# Patient Record
Sex: Female | Born: 1985 | Race: White | Marital: Single | Smoking: Former smoker
Health system: Southern US, Community
[De-identification: ages and names within clinical notes are randomized; demographics above are authoritative.]

## PROBLEM LIST (undated history)

## (undated) DIAGNOSIS — F419 Anxiety disorder, unspecified: Secondary | ICD-10-CM

## (undated) DIAGNOSIS — A630 Anogenital (venereal) warts: Secondary | ICD-10-CM

## (undated) HISTORY — DX: Anogenital (venereal) warts: A63.0

## (undated) HISTORY — DX: Anxiety disorder, unspecified: F41.9

---

## 2004-02-10 HISTORY — PX: CERVICAL BIOPSY  W/ LOOP ELECTRODE EXCISION: SUR135

## 2010-08-01 HISTORY — PX: CERVICAL BIOPSY: SHX590

## 2010-08-22 ENCOUNTER — Other Ambulatory Visit: Payer: Self-pay | Admitting: Internal Medicine

## 2010-08-22 DIAGNOSIS — R1013 Epigastric pain: Secondary | ICD-10-CM

## 2010-08-22 DIAGNOSIS — R102 Pelvic and perineal pain: Secondary | ICD-10-CM

## 2010-08-22 DIAGNOSIS — IMO0002 Reserved for concepts with insufficient information to code with codable children: Secondary | ICD-10-CM

## 2010-08-29 ENCOUNTER — Ambulatory Visit
Admission: RE | Admit: 2010-08-29 | Discharge: 2010-08-29 | Disposition: A | Discharge status: Home / Self Care | Payer: BC Managed Care – PPO | Source: Ambulatory Visit | Attending: Internal Medicine | Admitting: Internal Medicine

## 2010-08-29 ENCOUNTER — Other Ambulatory Visit: Payer: Self-pay

## 2010-08-29 DIAGNOSIS — R102 Pelvic and perineal pain: Secondary | ICD-10-CM

## 2010-08-29 DIAGNOSIS — IMO0002 Reserved for concepts with insufficient information to code with codable children: Secondary | ICD-10-CM

## 2010-08-29 DIAGNOSIS — R1013 Epigastric pain: Secondary | ICD-10-CM

## 2011-01-24 ENCOUNTER — Ambulatory Visit (INDEPENDENT_AMBULATORY_CARE_PROVIDER_SITE_OTHER): Payer: BC Managed Care – PPO

## 2011-01-24 DIAGNOSIS — Z23 Encounter for immunization: Secondary | ICD-10-CM

## 2011-02-15 ENCOUNTER — Ambulatory Visit (INDEPENDENT_AMBULATORY_CARE_PROVIDER_SITE_OTHER): Payer: BC Managed Care – PPO

## 2011-02-15 DIAGNOSIS — K5100 Ulcerative (chronic) pancolitis without complications: Secondary | ICD-10-CM

## 2011-04-24 ENCOUNTER — Ambulatory Visit (INDEPENDENT_AMBULATORY_CARE_PROVIDER_SITE_OTHER): Payer: BC Managed Care – PPO | Admitting: Physician Assistant

## 2011-04-24 VITALS — BP 119/81 | HR 83 | Temp 98.9°F | Resp 16 | Ht 63.0 in | Wt 115.8 lb

## 2011-04-24 DIAGNOSIS — R05 Cough: Secondary | ICD-10-CM

## 2011-04-24 DIAGNOSIS — J40 Bronchitis, not specified as acute or chronic: Secondary | ICD-10-CM

## 2011-04-24 DIAGNOSIS — R059 Cough, unspecified: Secondary | ICD-10-CM

## 2011-04-24 DIAGNOSIS — F419 Anxiety disorder, unspecified: Secondary | ICD-10-CM

## 2011-04-24 DIAGNOSIS — F411 Generalized anxiety disorder: Secondary | ICD-10-CM

## 2011-04-24 DIAGNOSIS — J019 Acute sinusitis, unspecified: Secondary | ICD-10-CM

## 2011-04-24 MED ORDER — CEFDINIR 300 MG PO CAPS: 300.0000 mg | ORAL_CAPSULE | Freq: Two times a day (BID) | ORAL | Status: AC

## 2011-04-24 MED ORDER — ALPRAZOLAM 0.25 MG PO TABS: 0.2500 mg | ORAL_TABLET | Freq: Two times a day (BID) | ORAL | Status: AC | PRN

## 2011-04-24 MED ORDER — FLUCONAZOLE 150 MG PO TABS: 150.0000 mg | ORAL_TABLET | Freq: Once | ORAL | Status: AC

## 2011-04-24 MED ORDER — IPRATROPIUM BROMIDE 0.06 % NA SOLN: 2.0000 | Freq: Three times a day (TID) | NASAL | Status: AC

## 2011-04-24 MED ORDER — HYDROCODONE-HOMATROPINE 5-1.5 MG/5ML PO SYRP: ORAL_SOLUTION | ORAL | Status: AC

## 2011-04-24 NOTE — Progress Notes (Signed)
Patient ID: Carol Castillo MRN: [id number], DOB: 02/28/1985, 26 y.o. Date of Encounter: 04/24/2011, 11:41 AM  Primary Physician: No primary provider on file.  Chief Complaint:  Chief Complaint  Patient presents with  . Cough    4 weeks  . Sinusitis    HPI: 26 y.o. year old female presents with 14 day history of nasal congestion, post nasal drip, sore throat, sinus pressure, and cough. Afebrile. No chills. Nasal congestion thick and green/yellow. Cough is productive of green/yellow sputum and worse at night when she lays down, keeping her awake. Ears feel full, leading to sensation of muffled hearing. Has tried OTC cold preps without success. No GI complaints. Appetite normal. Multiple sick contacts at one job.  She also mentions increased stress and anxiety at work. She recently started a second job. Currently working 60+ hours per week. Very anxious at her new job. Causing her to cry at home. Feels overwhelmed. No depression. Interested in counseling,  But just does not have the time for that currently with her work demands. She plans to cut back her hours over the summer, and will pursue counseling at that time.   No recent antibiotics, or recent travels.   No leg trauma, sedentary periods, or h/o cancer.  Past Medical History  Diagnosis Date  . Anxiety      Home Meds: Prior to Admission medications   Not on File    Allergies: No Known Allergies  History   Social History  . Marital Status: Single    Spouse Name: N/A    Number of Children: N/A  . Years of Education: N/A   Occupational History  . Not on file.   Social History Main Topics  . Smoking status: Current Everyday Smoker -- 0.8 packs/day    Types: Cigarettes  . Smokeless tobacco: Not on file  . Alcohol Use: Not on file  . Drug Use: Not on file  . Sexually Active: Not on file   Other Topics Concern  . Not on file   Social History Narrative  . No narrative on file     Review of  Systems: Constitutional: negative for chills, fever, night sweats or weight changes Cardiovascular: negative for chest pain or palpitations Respiratory: negative for hemoptysis, wheezing, or shortness of breath Abdominal: negative for abdominal pain, nausea, vomiting or diarrhea Dermatological: negative for rash Neurologic: negative for headache   Physical Exam: Blood pressure 119/81, pulse 83, temperature 98.9 F (37.2 C), temperature source Oral, resp. rate 16, height 5\' 3"  (1.6 m), weight 115 lb 12.8 oz (52.527 kg), last menstrual period 04/24/2011., Body mass index is 20.51 kg/(m^2). General: Well developed, well nourished, in no acute distress. Head: Normocephalic, atraumatic, eyes without discharge, sclera non-icteric, nares are congested. Bilateral auditory canals clear, TM's are without perforation, pearly grey with reflective cone of light bilaterally. Serous effusion bilaterally behind TM's.  Left maxillary sinus TTP. Oral cavity moist, dentition normal. Posterior pharynx with post nasal drip and mild erythema. No peritonsillar abscess or tonsillar exudate. Neck: Supple. No thyromegaly. Full ROM. No lymphadenopathy. Lungs: Clear bilaterally to auscultation without wheezes, rales, or rhonchi. Breathing is unlabored.  Heart: RRR with S1 S2. No murmurs, rubs, or gallops appreciated. Msk:  Strength and tone normal for age. Extremities: No clubbing or cyanosis. No edema. Neuro: Alert and oriented X 3. Moves all extremities spontaneously. CNII-XII grossly in tact. Psych:  Responds to questions appropriately with a normal affect.     ASSESSMENT AND PLAN:  26 y.o. year  old female with sinobronchitis and anxiety. 1. Sinobronchitis -Omnicef 300 mg 1 po bid #20 no RF  -Atrovent NS 0.06% 2 sprays each nare bid prn #1 no RF -Hycodan #4oz 1 tsp po q 4-6 hours prn cough no RF SED -Mucinex -Tylenol/Motrin prn -Rest/fluids -RTC precautions -RTC 3-5 days if no improvement -Fluconazole 150  mg #1 1 po now RF 1  2. Anxiety -Secondary to increased stress at 2 jobs -Plans to cut back on hours, this should help -Interested in seeing a psychologist over the summer once she has cut back some of her hours and has time during the day to have regular follow up with a psychologist -Trial of Xanax 0.25 mg #60 1 po bid no RF SED -Patient was advised the xanax is a temporary measure and that her definitive treatment will be counseling, she agrees and prefers counseling -Call with update 1 month  Signed, Eula Listen, PA-C 04/24/2011 11:41 AM

## 2011-06-02 ENCOUNTER — Telehealth: Payer: Self-pay

## 2011-06-02 NOTE — Telephone Encounter (Signed)
Pt is calling in about her rx refill on xanax and would like to let Eula Listen know that everything is okay and she is doing well with the dose of xanax she is currently taking.

## 2011-06-03 ENCOUNTER — Other Ambulatory Visit: Payer: Self-pay | Admitting: Physician Assistant

## 2011-06-29 ENCOUNTER — Telehealth: Payer: Self-pay

## 2011-06-29 NOTE — Telephone Encounter (Signed)
Pt wanted to let dr Hal Hope know she has quit smoking and wants to get a lo estrogen birth control pill Please call pt to advise

## 2011-06-30 NOTE — Telephone Encounter (Signed)
Paper chart please 

## 2011-06-30 NOTE — Telephone Encounter (Signed)
Chart pulled to PA 

## 2011-07-01 NOTE — Telephone Encounter (Signed)
Ok, come into the clinic so we can do this.

## 2011-07-01 NOTE — Telephone Encounter (Signed)
Explained that pt will need to be seen 1st. Pt agreed but works two jobs and has a hard time making the time to get in. Advised pt she might try to come in 1st thing in the am at 102, and transferred pt to 104 to check for avail appts.

## 2011-07-21 ENCOUNTER — Encounter: Payer: Self-pay | Admitting: Family Medicine

## 2011-07-21 ENCOUNTER — Ambulatory Visit (INDEPENDENT_AMBULATORY_CARE_PROVIDER_SITE_OTHER): Payer: BC Managed Care – PPO | Admitting: Family Medicine

## 2011-07-21 VITALS — BP 119/79 | HR 72 | Temp 98.7°F | Resp 18 | Ht 63.0 in | Wt 152.8 lb

## 2011-07-21 DIAGNOSIS — IMO0001 Reserved for inherently not codable concepts without codable children: Secondary | ICD-10-CM

## 2011-07-21 DIAGNOSIS — Z309 Encounter for contraceptive management, unspecified: Secondary | ICD-10-CM

## 2011-07-21 DIAGNOSIS — F17200 Nicotine dependence, unspecified, uncomplicated: Secondary | ICD-10-CM

## 2011-07-21 DIAGNOSIS — Z8742 Personal history of other diseases of the female genital tract: Secondary | ICD-10-CM

## 2011-07-21 DIAGNOSIS — Z3009 Encounter for other general counseling and advice on contraception: Secondary | ICD-10-CM

## 2011-07-21 DIAGNOSIS — Z720 Tobacco use: Secondary | ICD-10-CM | POA: Insufficient documentation

## 2011-07-21 MED ORDER — NORETHIN-ETH ESTRAD-FE BIPHAS 1 MG-10 MCG / 10 MCG PO TABS: 1.0000 | ORAL_TABLET | Freq: Every day | ORAL | Status: DC

## 2011-07-21 NOTE — Progress Notes (Signed)
  Subjective:    Patient ID: Carol Castillo, female    DOB: [birth year], 26 y.o.   MRN: [id number]  HPI  This 26 y.o. Cauc female is here for contraception counseling, requesting OCPs- low dose.  She took OCPs 5 years ago but had some minor side effects. PMHx remarkable for Abnormal PAP  with LEEP procedure in 2006 and recent biopsy summer 2012. Received HPV vaccine series last year.  She has not returned to [facility] for follow-up (was due in Jan 2013). Not prepared for  CPE/PAP today. Menses are regular and pt denies pelvic pain or abnormal bleeding. No hx of headache  disorder or cardiac disease or clotting disorder.         She is getting married in September and would like to get established on contraception; she  smokes < 1/2 ppd and plans to quit smoking before her wedding day. Firmly states that she does not want   pregnancy at this time. She has had a lot of anxiety associated with 2 jobs which led to relapse with tobacco  use. 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 She uses Alprazolam sparingly.     Review of Systems As per HPI    Objective:   Physical Exam  Vitals reviewed. Constitutional: She is oriented to person, place, and time. She appears well-developed and well-nourished. No distress.  HENT:  Head: Normocephalic and atraumatic.  Eyes: Conjunctivae and EOM are normal. No scleral icterus.  Cardiovascular: Normal rate.   Pulmonary/Chest: Effort normal. No respiratory distress.  Musculoskeletal: Normal range of motion.  Neurological: She is alert and oriented to person, place, and time. No cranial nerve deficit.  Psychiatric: She has a normal mood and affect. Her behavior is normal.          Assessment & Plan:   1. General counselling and advice on contraception - offered extended cycle OCP but, given pt's Abnormal PAP hx, feel she  should continue to have menstrual cycle monthly  2. Contraception - RX: Lo Loestrin Fe  1 package with 5 RFs  3. History of abnormal cervical Pap smear - pt encouraged to schedule GYN re-evaluation at earliest convenience  4. Tobacco user - smoking cessation ASAP; discussed risks of tobacco use with OCPs

## 2011-07-21 NOTE — Patient Instructions (Addendum)
Smoking Cessation This document explains the best ways for you to quit smoking and new treatments to help. It lists new medicines that can double or triple your chances of quitting and quitting for good. It also considers ways to avoid relapses and concerns you may have about quitting, including weight gain. NICOTINE: A POWERFUL ADDICTION If you have tried to quit smoking, you know how hard it can be. It is hard because nicotine is a very addictive drug. For some people, it can be as addictive as heroin or cocaine. Usually, people make 2 or 3 tries, or more, before finally being able to quit. Each time you try to quit, you can learn about what helps and what hurts. Quitting takes hard work and a lot of effort, but you can quit smoking. QUITTING SMOKING IS ONE OF THE MOST IMPORTANT THINGS YOU WILL EVER DO.  You will live longer, feel better, and live better.   The impact on your body of quitting smoking is felt almost immediately:   Within 20 minutes, blood pressure decreases. Pulse returns to its normal level.   After 8 hours, carbon monoxide levels in the blood return to normal. Oxygen level increases.   After 24 hours, chance of heart attack starts to decrease. Breath, hair, and body stop smelling like smoke.   After 48 hours, damaged nerve endings begin to recover. Sense of taste and smell improve.   After 72 hours, the body is virtually free of nicotine. Bronchial tubes relax and breathing becomes easier.   After 2 to 12 weeks, lungs can hold more air. Exercise becomes easier and circulation improves.   Quitting will reduce your risk of having a heart attack, stroke, cancer, or lung disease:   After 1 year, the risk of coronary heart disease is cut in half.   After 5 years, the risk of stroke falls to the same as a nonsmoker.   After 10 years, the risk of lung cancer is cut in half and the risk of other cancers decreases significantly.   After 15 years, the risk of coronary heart  disease drops, usually to the level of a nonsmoker.   If you are pregnant, quitting smoking will improve your chances of having a healthy baby.   The people you live with, especially your children, will be healthier.   You will have extra money to spend on things other than cigarettes.  FIVE KEYS TO QUITTING Studies have shown that these 5 steps will help you quit smoking and quit for good. You have the best chances of quitting if you use them together: 1. Get ready.  2. Get support and encouragement.  3. Learn new skills and behaviors.  4. Get medicine to reduce your nicotine addiction and use it correctly.  5. Be prepared for relapse or difficult situations. Be determined to continue trying to quit, even if you do not succeed at first.  1. GET READY  Set a quit date.   Change your environment.   Get rid of ALL cigarettes, ashtrays, matches, and lighters in your home, car, and place of work.   Do not let people smoke in your home.   Review your past attempts to quit. Think about what worked and what did not.   Once you quit, do not smoke. NOT EVEN A PUFF!  2. GET SUPPORT AND ENCOURAGEMENT Studies have shown that you have a better chance of being successful if you have help. You can get support in many ways.  Tell   your family, friends, and coworkers that you are going to quit and need their support. Ask them not to smoke around you.   Talk to your caregivers (doctor, dentist, nurse, pharmacist, psychologist, and/or smoking counselor).   Get individual, group, or telephone counseling and support. The more counseling you have, the better your chances are of quitting. Programs are available at local hospitals and health centers. Call your local health department for information about programs in your area.   Spiritual beliefs and practices may help some smokers quit.   Quit meters are small computer programs online or downloadable that keep track of quit statistics, such as amount  of "quit-time," cigarettes not smoked, and money saved.   Many smokers find one or more of the many self-help books available useful in helping them quit and stay off tobacco.  3. LEARN NEW SKILLS AND BEHAVIORS  Try to distract yourself from urges to smoke. Talk to someone, go for a walk, or occupy your time with a task.   When you first try to quit, change your routine. Take a different route to work. Drink tea instead of coffee. Eat breakfast in a different place.   Do something to reduce your stress. Take a hot bath, exercise, or read a book.   Plan something enjoyable to do every day. Reward yourself for not smoking.   Explore interactive web-based programs that specialize in helping you quit.  4. GET MEDICINE AND USE IT CORRECTLY Medicines can help you stop smoking and decrease the urge to smoke. Combining medicine with the above behavioral methods and support can quadruple your chances of successfully quitting smoking. The U.S. Food and Drug Administration (FDA) has approved 7 medicines to help you quit smoking. These medicines fall into 3 categories.  Nicotine replacement therapy (delivers nicotine to your body without the negative effects and risks of smoking):   Nicotine gum: Available over-the-counter.   Nicotine lozenges: Available over-the-counter.   Nicotine inhaler: Available by prescription.   Nicotine nasal spray: Available by prescription.   Nicotine skin patches (transdermal): Available by prescription and over-the-counter.   Antidepressant medicine (helps people abstain from smoking, but how this works is unknown):   Bupropion sustained-release (SR) tablets: Available by prescription.   Nicotinic receptor partial agonist (simulates the effect of nicotine in your brain):   Varenicline tartrate tablets: Available by prescription.   Ask your caregiver for advice about which medicines to use and how to use them. Carefully read the information on the package.    Everyone who is trying to quit may benefit from using a medicine. If you are pregnant or trying to become pregnant, nursing an infant, you are under age 18, or you smoke fewer than 10 cigarettes per day, talk to your caregiver before taking any nicotine replacement medicines.   You should stop using a nicotine replacement product and call your caregiver if you experience nausea, dizziness, weakness, vomiting, fast or irregular heartbeat, mouth problems with the lozenge or gum, or redness or swelling of the skin around the patch that does not go away.   Do not use any other product containing nicotine while using a nicotine replacement product.   Talk to your caregiver before using these products if you have diabetes, heart disease, asthma, stomach ulcers, you had a recent heart attack, you have high blood pressure that is not controlled with medicine, a history of irregular heartbeat, or you have been prescribed medicine to help you quit smoking.  5. BE PREPARED FOR RELAPSE OR   DIFFICULT SITUATIONS  Most relapses occur within the first 3 months after quitting. Do not be discouraged if you start smoking again. Remember, most people try several times before they finally quit.   You may have symptoms of withdrawal because your body is used to nicotine. You may crave cigarettes, be irritable, feel very hungry, cough often, get headaches, or have difficulty concentrating.   The withdrawal symptoms are only temporary. They are strongest when you first quit, but they will go away within 10 to 14 days.  Here are some difficult situations to watch for:  Alcohol. Avoid drinking alcohol. Drinking lowers your chances of successfully quitting.   Caffeine. Try to reduce the amount of caffeine you consume. It also lowers your chances of successfully quitting.   Other smokers. Being around smoking can make you want to smoke. Avoid smokers.   Weight gain. Many smokers will gain weight when they quit, usually  less than 10 pounds. Eat a healthy diet and stay active. Do not let weight gain distract you from your main goal, quitting smoking. Some medicines that help you quit smoking may also help delay weight gain. You can always lose the weight gained after you quit.   Bad mood or depression. There are a lot of ways to improve your mood other than smoking.  If you are having problems with any of these situations, talk to your caregiver. SPECIAL SITUATIONS AND CONDITIONS Studies suggest that everyone can quit smoking. Your situation or condition can give you a special reason to quit.  Pregnant women/new mothers: By quitting, you protect your baby's health and your own.   Hospitalized patients: By quitting, you reduce health problems and help healing.   Heart attack patients: By quitting, you reduce your risk of a second heart attack.   Lung, head, and neck cancer patients: By quitting, you reduce your chance of a second cancer.   Parents of children and adolescents: By quitting, you protect your children from illnesses caused by secondhand smoke.  QUESTIONS TO THINK ABOUT Think about the following questions before you try to stop smoking. You may want to talk about your answers with your caregiver.  Why do you want to quit?   If you tried to quit in the past, what helped and what did not?   What will be the most difficult situations for you after you quit? How will you plan to handle them?   Who can help you through the tough times? Your family? Friends? Caregiver?   What pleasures do you get from smoking? What ways can you still get pleasure if you quit?  Here are some questions to ask your caregiver:  How can you help me to be successful at quitting?   What medicine do you think would be best for me and how should I take it?   What should I do if I need more help?   What is smoking withdrawal like? How can I get information on withdrawal?  Quitting takes hard work and a lot of effort,  but you can quit smoking. FOR MORE INFORMATION  Smokefree.gov (http://www.davis-sullivan.com/) provides free, accurate, evidence-based information and professional assistance to help support the immediate and long-term needs of people trying to quit smoking. Document Released: 01/20/2001 Document Revised: 01/15/2011 Document Reviewed: 11/12/2008 [facility] Patient Information 2012 [place], [place].     Schedule your follow-up visit with GYN within next 4-6 months, if not sooner.  Please plan on quitting tobacco use as soon as possible.

## 2011-07-30 ENCOUNTER — Telehealth: Payer: Self-pay

## 2011-07-30 MED ORDER — NORETHINDRONE ACET-ETHINYL EST 1-20 MG-MCG PO TABS: 1.0000 | ORAL_TABLET | Freq: Every day | ORAL | Status: DC

## 2011-07-30 NOTE — Telephone Encounter (Signed)
PATIENT SAW [employer] AND WAS PRESCRIBED LOESTRA, BIRTH CONTROL.  SHE SAID IS TO EXPENSIVE AND WOULD LIKE THE REGULAR LOESTRA.  PLEASE CALL

## 2011-07-30 NOTE — Telephone Encounter (Signed)
Done and sent in 

## 2011-07-31 NOTE — Telephone Encounter (Signed)
LMOM that rx was sent to pharmacy 

## 2011-09-30 ENCOUNTER — Telehealth: Payer: Self-pay

## 2011-09-30 NOTE — Telephone Encounter (Signed)
Pt states her menses will interfere with her wedding and would like a different birth control or information on how to prevent being on menses during her honeymoon.  [facility]  please call [phone]

## 2011-09-30 NOTE — Telephone Encounter (Signed)
Dow Adolph, MD 07/21/2011 4:31 PM Signed    Subjective:    Patient ID: Carol Castillo, female DOB: 24-Sep-1985, 26 y.o. MRN: [id number]  HPI This 26 y.o. Cauc female is here for contraception counseling, requesting OCPs- low dose.  She took OCPs 5 years ago but had some minor side effects. PMHx remarkable for Abnormal PAP  with LEEP procedure in 2006 and recent biopsy summer 2012. Received HPV vaccine series last year.  She has not returned to [facility] for follow-up (was due in Jan 2013). Not prepared for  CPE/PAP today. Menses are regular and pt denies pelvic pain or abnormal bleeding. No hx of headache  disorder or cardiac disease or clotting disorder.  She is getting married in September and would like to get established on contraception; she  smokes < 1/2 ppd and plans to quit smoking before her wedding day. Firmly states that she does not want  pregnancy at this time. She has had a lot of anxiety associated with 2 jobs which led to relapse with tobacco  use. 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 She uses Alprazolam sparingly.  Review of Systems As per HPI     Objective:    Physical Exam  Vitals reviewed.  Constitutional: She is oriented to person, place, and time. She appears well-developed and well-nourished. No distress.  HENT:  Head: Normocephalic and atraumatic.  Eyes: Conjunctivae and EOM are normal. No scleral icterus.  Cardiovascular: Normal rate.  Pulmonary/Chest: Effort normal. No respiratory distress.  Musculoskeletal: Normal range of motion.  Neurological: She is alert and oriented to person, place, and time. No cranial nerve deficit.  Psychiatric: She has a normal mood and affect. Her behavior is normal.      Assessment & Plan:    1.  General counselling and advice on contraception - offered extended cycle OCP but, given pt's    Abnormal PAP hx, feel she should continue to have menstrual cycle monthly     2.  Contraception - RX: Lo Loestrin Fe 1 package with 5 RFs     3.  History of abnormal cervical Pap smear - pt encouraged to schedule GYN re-evaluation at earliest convenience     4.  Tobacco user - smoking cessation ASAP; discussed risks of tobacco use with OCPs           Electronic signature on 07/21/2011      Please advise message below, office visit from June above.

## 2011-10-01 NOTE — Telephone Encounter (Signed)
She can do continuous cycling, meaning she does not take placebo week and immediately starts next pack.

## 2011-10-01 NOTE — Telephone Encounter (Signed)
PATIENT CALLED AGAIN AND SAYS SHE HAS TRIED TO SKIP THE PLACEBO AND IMMEDIATELY START THE NEXT PACK PREVIOUSLY, BUT SHE STILL HAD HER PERIOD. SHE WAS WONDERING IF THERE IS SOMETHING ELSE SHE CAN TAKE TO MAKE SURE SHE DOES NOT HAVE HER PERIOD ON HER WEDDING DAY/  BEST [phone]  [id number] - [place],  - [address]

## 2011-10-02 NOTE — Telephone Encounter (Signed)
D/W pt that we could try a higher dose of OCP and she agreed that she would like to try that, but only wants one month's worth and for her RFs on Lo estrin to stay active bc she will want to go back to these afterward. No need to call pt back if there are no problems doing the above, she will just check w/her pharmacy.

## 2011-10-02 NOTE — Telephone Encounter (Signed)
Please contact she this concerning her wedding date and she needs response..[phone]

## 2011-10-02 NOTE — Telephone Encounter (Signed)
Is there anything else that would help?

## 2011-10-02 NOTE — Telephone Encounter (Signed)
We can try a higher dose OCP instead of her Lo estrin. Otherwise there are not a lot of other options.

## 2011-10-04 MED ORDER — NORETHINDRONE-MESTRANOL 1-50 MG-MCG PO TABS: 1.0000 | ORAL_TABLET | Freq: Every day | ORAL | Status: DC

## 2011-10-04 NOTE — Telephone Encounter (Signed)
Ortho-Novum 1/50 Rx'd.

## 2011-11-06 ENCOUNTER — Telehealth: Payer: Self-pay

## 2011-11-06 ENCOUNTER — Ambulatory Visit (INDEPENDENT_AMBULATORY_CARE_PROVIDER_SITE_OTHER): Payer: BC Managed Care – PPO | Admitting: Family Medicine

## 2011-11-06 VITALS — BP 118/70 | HR 82 | Temp 98.0°F | Resp 16 | Ht 63.0 in | Wt 147.0 lb

## 2011-11-06 DIAGNOSIS — K5289 Other specified noninfective gastroenteritis and colitis: Secondary | ICD-10-CM

## 2011-11-06 DIAGNOSIS — K529 Noninfective gastroenteritis and colitis, unspecified: Secondary | ICD-10-CM

## 2011-11-06 DIAGNOSIS — R51 Headache: Secondary | ICD-10-CM

## 2011-11-06 DIAGNOSIS — R110 Nausea: Secondary | ICD-10-CM

## 2011-11-06 DIAGNOSIS — R197 Diarrhea, unspecified: Secondary | ICD-10-CM

## 2011-11-06 DIAGNOSIS — R519 Headache, unspecified: Secondary | ICD-10-CM

## 2011-11-06 LAB — POCT CBC
Granulocyte percent: 56.1 %G (ref 37–80)
HCT, POC: 45.4 % (ref 37.7–47.9)
Hemoglobin: 14.3 g/dL (ref 12.2–16.2)
Lymph, poc: 2.3 (ref 0.6–3.4)
MCH, POC: 28 pg (ref 27–31.2)
MCHC: 31.5 g/dL — AB (ref 31.8–35.4)
MCV: 88.8 fL (ref 80–97)
MID (cbc): 0.4 (ref 0–0.9)
MPV: 9.4 fL (ref 0–99.8)
POC Granulocyte: 3.5 (ref 2–6.9)
POC LYMPH PERCENT: 37.3 %L (ref 10–50)
POC MID %: 6.6 %M (ref 0–12)
Platelet Count, POC: 280 10*3/uL (ref 142–424)
RBC: 5.11 M/uL (ref 4.04–5.48)
RDW, POC: 15 %
WBC: 6.3 10*3/uL (ref 4.6–10.2)

## 2011-11-06 LAB — COMPREHENSIVE METABOLIC PANEL WITH GFR
Albumin: 4.4 g/dL (ref 3.5–5.2)
CO2: 25 meq/L (ref 19–32)
Glucose, Bld: 84 mg/dL (ref 70–99)
Potassium: 4.6 meq/L (ref 3.5–5.3)
Sodium: 140 meq/L (ref 135–145)
Total Bilirubin: 0.4 mg/dL (ref 0.3–1.2)
Total Protein: 6.6 g/dL (ref 6.0–8.3)

## 2011-11-06 LAB — COMPREHENSIVE METABOLIC PANEL
ALT: 17 U/L (ref 0–35)
AST: 19 U/L (ref 0–37)
Alkaline Phosphatase: 48 U/L (ref 39–117)
BUN: 11 mg/dL (ref 6–23)
Calcium: 9.2 mg/dL (ref 8.4–10.5)
Chloride: 105 mEq/L (ref 96–112)
Creat: 0.81 mg/dL (ref 0.50–1.10)

## 2011-11-06 MED ORDER — CIPROFLOXACIN HCL 500 MG PO TABS: 500.0000 mg | ORAL_TABLET | Freq: Two times a day (BID) | ORAL | Status: DC

## 2011-11-06 MED ORDER — PROMETHAZINE HCL 25 MG PO TABS: 25.0000 mg | ORAL_TABLET | Freq: Three times a day (TID) | ORAL | Status: DC | PRN

## 2011-11-06 NOTE — Telephone Encounter (Signed)
The patient would like refill of Necon 1-50.  The patient stated she prefers this medication.  Please call the patient at [phone] with any questions.

## 2011-11-06 NOTE — Progress Notes (Signed)
[facility]:  Office Visit  Chief Complaint:  Chief Complaint  Patient presents with  . Headache    pressure in head- last night Pt was "confused"  . Nausea  . Fatigue    had very low temp last night- 95.6    HPI: Carol Castillo is a 26 y.o. female who complains of  nonbloody diarrhea, nausea and occipital HA and hypothermia x 4 days. Was in [place] for wedding and came home on Monday and started having diarrhea after every meal. Does not matter what type of foods she has. Tried Ibuprofen, Execrdin and some pain med without relief. Some vision changes.-blurry lines that have now resulved. No slurred speech or asymmetric weakness or gait changes. Denies photophobia or phonophobia.   Past Medical History  Diagnosis Date  . Anxiety   . Genital warts     Treated with Aldara   Past Surgical History  Procedure Date  . Cervical biopsy  w/ loop electrode excision 2006    Moderate Dysplasia  . Cervical biopsy 08/01/2010    Low Grade Squamous Intraepithelial lesion, CIN-1 (mild dysplasia)   History   Social History  . Marital Status: Single    Spouse Name: N/A    Number of Children: N/A  . Years of Education: N/A   Social History Main Topics  . Smoking status: Current Every Day Smoker -- 0.8 packs/day    Types: Cigarettes  . Smokeless tobacco: None  . Alcohol Use: None  . Drug Use: None  . Sexually Active: None   Other Topics Concern  . None   Social History Narrative  . None   No family history on file. No Known Allergies Prior to Admission medications   Medication Sig Start Date End Date Taking? Authorizing Provider  ALPRAZolam Prudy Feeler) 0.25 MG tablet TAKE 1 TABLET BY MOUTH TWICE DAILY AS NEEDED FOR SLEEP 06/03/11  Yes Ryan M Dunn, PA-C  Norethindrone-Mestranol (NECON) 1-50 MG-MCG tablet Take 1 tablet by mouth daily. 10/04/11 10/03/12 Yes Chelle S Jeffery, PA-C  ipratropium (ATROVENT) 0.06 % nasal spray Place 2 sprays into the nose 3 (three) times  daily. 04/24/11 04/23/12  Raymon Mutton Dunn, PA-C  norethindrone-ethinyl estradiol (LOESTRIN 1/20, 21,) 1-20 MG-MCG tablet Take 1 tablet by mouth daily. 07/30/11 07/29/12  Raymon Mutton Dunn, PA-C  Norethindrone-Ethinyl Estradiol-Fe Biphas (LO LOESTRIN FE) 1 MG-10 MCG / 10 MCG tablet Take 1 tablet by mouth daily. 07/21/11 07/20/12  Maurice March, MD     ROS: The patient denies fevers, chills, night sweats, unintentional weight loss, chest pain, palpitations, wheezing, dyspnea on exertion, vomiting, abdominal pain, dysuria, hematuria, melena, numbness, weakness, or tingling.   All other systems have been reviewed and were otherwise negative with the exception of those mentioned in the HPI and as above.    PHYSICAL EXAM: Filed Vitals:   11/06/11 0844  BP: 118/70  Pulse: 82  Temp: 98 F (36.7 C)  Resp: 16   Filed Vitals:   11/06/11 0844  Height: 5\' 3"  (1.6 m)  Weight: 147 lb (66.679 kg)   Body mass index is 26.04 kg/(m^2).  General: Alert, no acute distress HEENT:  Normocephalic, atraumatic, oropharynx patent. EOMI, PERRLA, fundoscopic exam nl Cardiovascular:  Regular rate and rhythm, no rubs murmurs or gallops.  No Carotid bruits, radial pulse intact. No pedal edema.  Respiratory: Clear to auscultation bilaterally.  No wheezes, rales, or rhonchi.  No cyanosis, no use of accessory musculature GI: No organomegaly, abdomen is soft and non-tender, positive  bowel sounds.  No masses. No guarding or rebound Skin: No rashes. Neurologic: Facial musculature symmetric. Psychiatric: Patient is appropriate throughout our interaction. Lymphatic: No cervical lymphadenopathy Musculoskeletal: Gait intact.   LABS: No results found for this or any previous visit.   EKG/XRAY:   Primary read interpreted by Dr. Conley Rolls at [facility].   ASSESSMENT/PLAN: Encounter Diagnoses  Name Primary?  . Gastroenteritis Yes  . Nausea   . Bilateral headaches   . Diarrhea     Rx Cipro 500 mg BID x 10 D for presumptive  infectious gastroenteritis, stool cx pending Rx Phenergan IF no improvement then get CT scan of head if HA, nausea does not improve   Kadience Macchi PHUONG, DO 11/06/2011 9:24 AM

## 2011-11-07 ENCOUNTER — Telehealth: Payer: Self-pay

## 2011-11-07 NOTE — Telephone Encounter (Signed)
CALLING FOR LAB RESULTS. OK TO LEAVE LAB RESULTS ON MESSAGE IF SHE DOES NOT ANSWER. PLEASE LEAVE THE RESULTS IF NO ANSWER

## 2011-11-08 MED ORDER — NORETHINDRONE-MESTRANOL 1-50 MG-MCG PO TABS: 1.0000 | ORAL_TABLET | Freq: Every day | ORAL | Status: DC

## 2011-11-08 NOTE — Telephone Encounter (Signed)
Refill done and sent in. 

## 2011-11-08 NOTE — Telephone Encounter (Signed)
PT CALLED AGAIN REQUESTING HER LAB RESULTS.  PLEASE CALL

## 2011-11-09 ENCOUNTER — Telehealth: Payer: Self-pay

## 2011-11-09 NOTE — Telephone Encounter (Signed)
Patient has called for the third time about lab results and is worried because she is now having other symptoms (vomiting). I advised her to come in and be seen but it is difficult for her to take off work. She will wait until she hears from [place] or will come in if she can.  Best # [phone] (this is her cell and you can leave a message if she cannot answer while she is at work)

## 2011-11-09 NOTE — Telephone Encounter (Signed)
Eat smaller, frequent meals, as she is doing.  Try to avoid fatty foods (including whole milk, ice cream), spicy foods, acidic foods (including tomatoes, tomato sauce, OJ, lemonade), alcohol, nicotine, NSAIDS.    Stress can worsen symptoms, as can caffeine.  It is also possible that her birth control pill (the higher dose of the Necon) may be contributing.  She should come in for re-evaluation, it is ok to wait until her stool studies are back, but if her symptoms worsen, she shouldn't wait.

## 2011-11-09 NOTE — Telephone Encounter (Signed)
Acetaminophen (Tylenol) is not an NSAID. She could try Ranitidine 150 mg (OTC) BID.   She is always welcome to come on back for additional evaluation and treatment.

## 2011-11-09 NOTE — Telephone Encounter (Signed)
Patients stool cultures pending. I have called patient. Left message for her to call me back.

## 2011-11-09 NOTE — Telephone Encounter (Signed)
I spoke to her and she is vomiting after meals, she states after she vomits she feels better. If she eats very small meals she feels better. She advised she may need to return. She states she does not want to return until after her stool cultures are back. Please advise if anything can be done for her vomiting without an office visit.

## 2011-11-09 NOTE — Telephone Encounter (Signed)
Pt CB and reports that she is tolerating smaller meals better than larger ones, so she will cont that. She has not been eating anything heavy or spicy, etc. Pt has been using NSAIDS and has been alternating between Aleve, Tyl, Ibuprofen, Excedrin, because her HAs are still very bad and none of these have been helping her HAs. Pt asked if there is anything that wouldn't be an NSAID that we could send in for her HAs? Pt had been waiting on RF of her Necon and had been off of it for a week, so she doesn't think that is causing her vomiting.

## 2011-11-09 NOTE — Telephone Encounter (Signed)
LMOM to CB. 

## 2011-11-09 NOTE — Telephone Encounter (Signed)
dghdg

## 2011-11-10 LAB — OVA AND PARASITE SCREEN: OP: NONE SEEN

## 2011-11-10 LAB — STOOL CULTURE

## 2011-11-10 NOTE — Telephone Encounter (Signed)
Lm for her to call me back

## 2011-11-10 NOTE — Telephone Encounter (Signed)
LMOM for pt w/Chelle's info and encouraged pt to RTC for re-eval. Asked for CB w/ any further ?s and gave her our hours.

## 2011-11-12 ENCOUNTER — Encounter: Payer: Self-pay | Admitting: Family Medicine

## 2011-11-12 ENCOUNTER — Telehealth: Payer: Self-pay | Admitting: Family Medicine

## 2011-11-12 NOTE — Telephone Encounter (Signed)
Called patient per Dr. Conley Rolls. [facility] to CB-patient not at home. Labs including OP were negative. Did not leave lab details.

## 2011-12-04 DIAGNOSIS — G43909 Migraine, unspecified, not intractable, without status migrainosus: Secondary | ICD-10-CM | POA: Insufficient documentation

## 2012-07-26 IMAGING — US US TRANSVAGINAL NON-OB
1 series · 13 of 25 positions shown · non-contrast
Comparison: [HOSPITAL] at [HOSPITAL] abdominal
ultrasound 08/29/2010.

CLINICAL DATA: Dyspareunia/pelvic pain with history ovarian cysts.
Last menstrual period 08/10/2010.

TRANSABDOMINAL AND TRANSVAGINAL ULTRASOUND OF PELVIS
TECHNIQUE: Both transabdominal and transvaginal ultrasound
examinations of the pelvis were performed. Transabdominal technique
was performed for global imaging of the pelvis including uterus,
ovaries, adnexal regions, and pelvic cul-de-sac.

[Series 1: us transvaginal non-ob · 0.23mm/px · 13 of 76 slices shown]
[im 1/76]
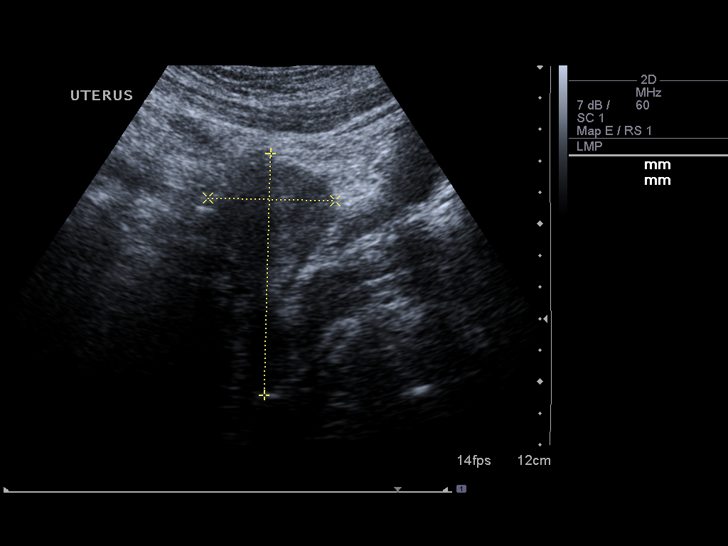
[im 7/76]
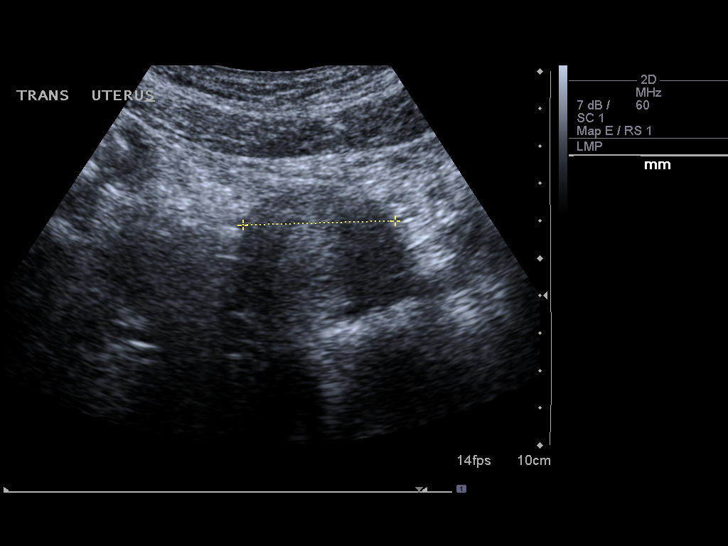
[im 13/76]
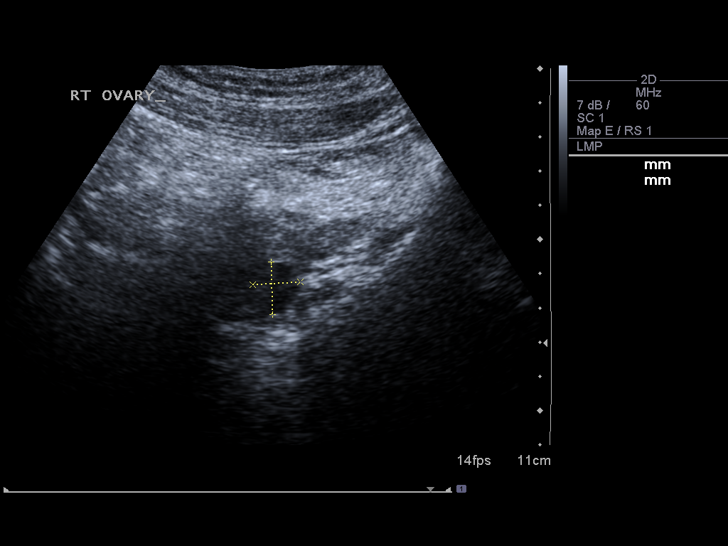
[im 19/76]
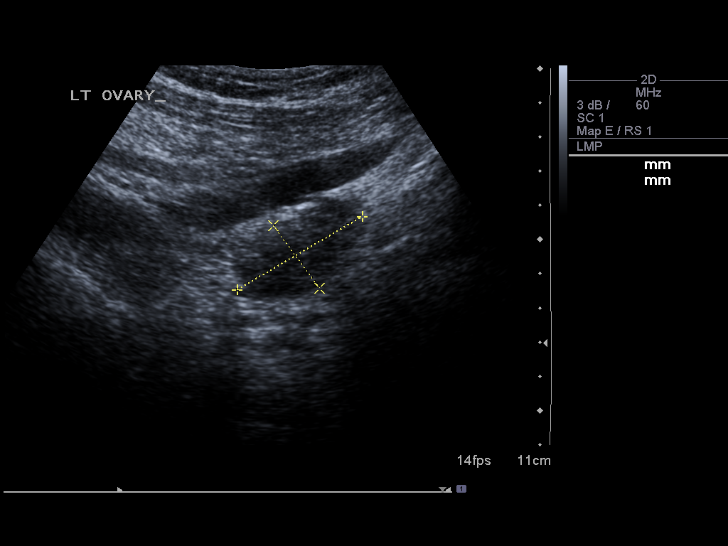
[im 26/76]
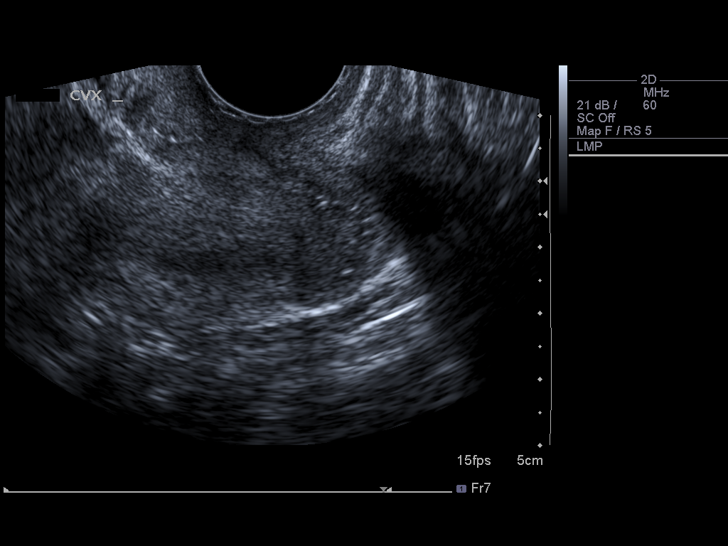
[im 32/76]
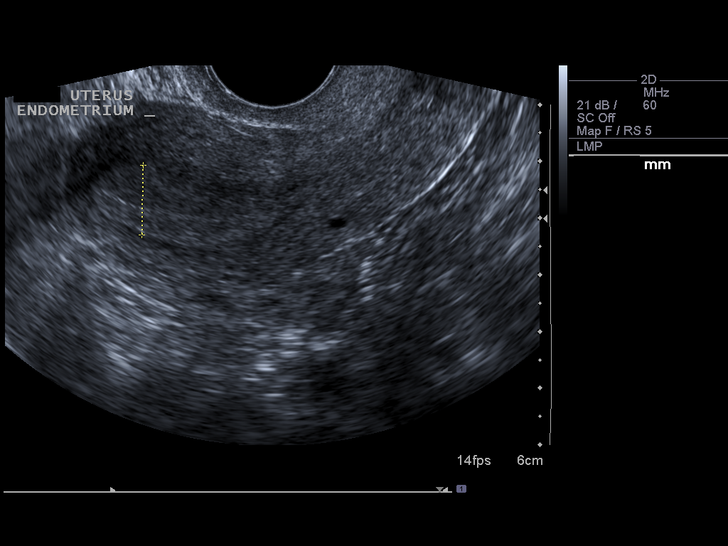
[im 38/76]
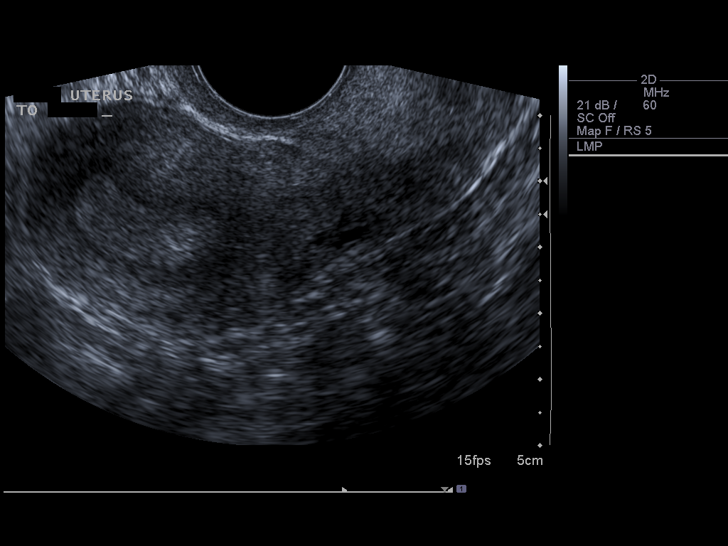
[im 44/76]
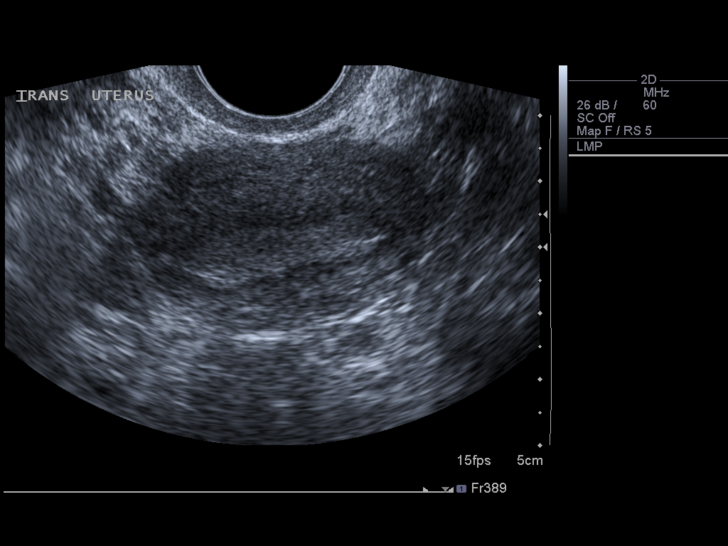
[im 51/76]
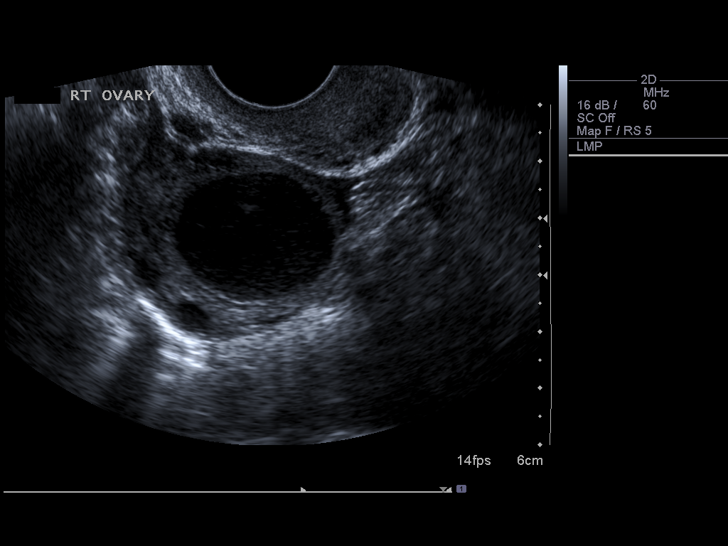
[im 57/76]
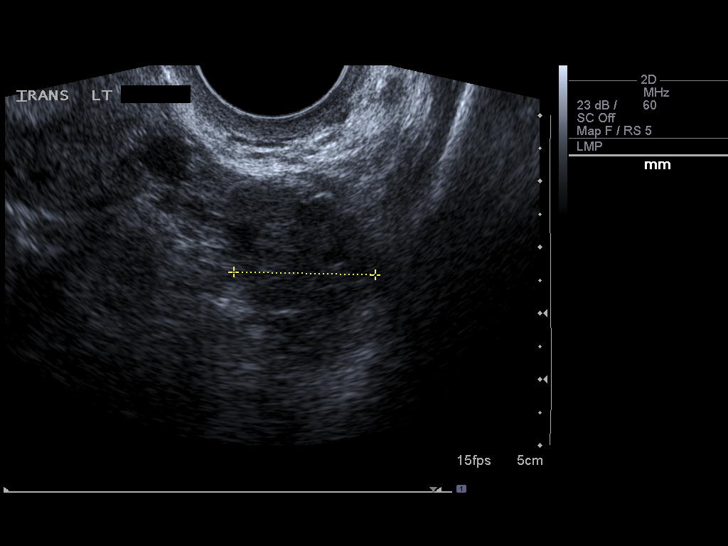
[im 63/76]
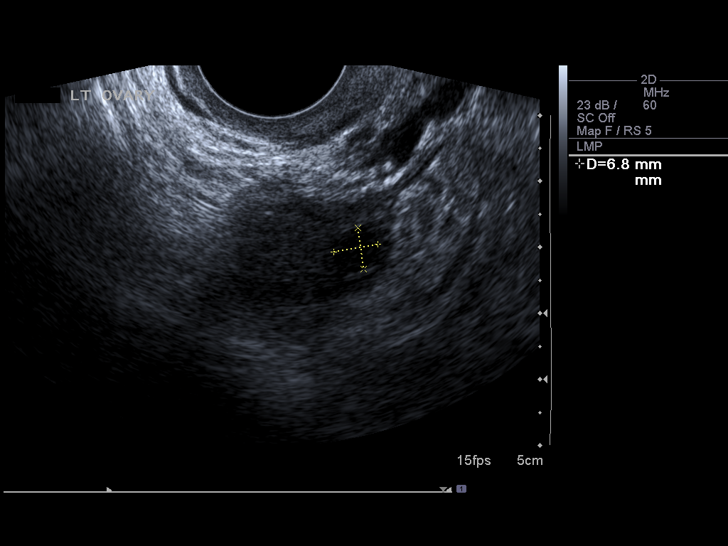
[im 69/76]
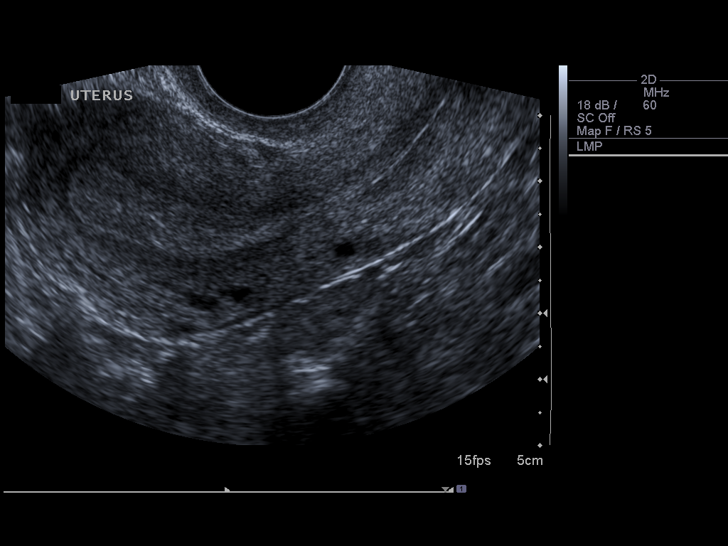
[im 76/76]
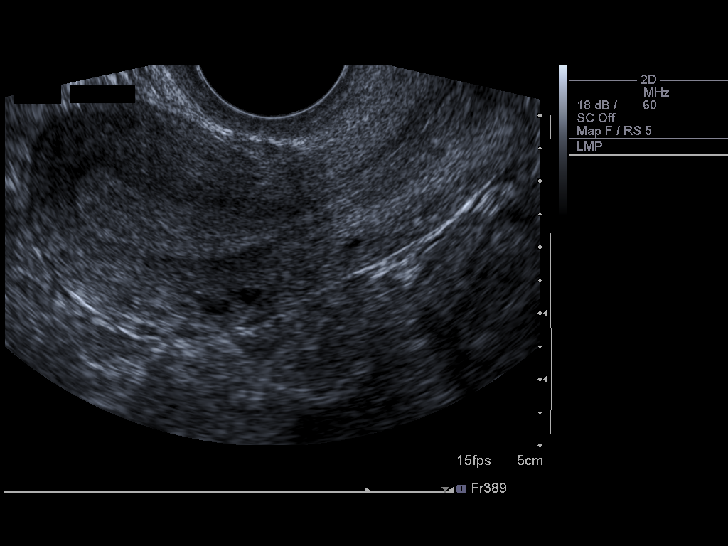

[13 of 25 positions shown; findings below may reference images not displayed]

It was necessary to proceed with endovaginal exam following the
transabdomnial exam to visualize the endometrium and bilateral
ovaries.
FINDINGS: Uterus: Sonographically normal for age measuring 7.7 cm long X
cm AP X 4.1 cm wide.

Endometrium: Sonographically normal for age (homogeneous with no
focal lesion) measuring 12 mm AP fundal thickness.

Right ovary:  Measures upper limits of normal in size at 4.2 cm
long X 3.5 cm AP X 3.6 cm wide with ovarian follicles and dominant
slightly complex right ovarian cyst measuring 2.9 cm.

Left ovary: Sonographically normal measuring 2.8 cm long X 1.7 cm
AP X 2.1 cm wide.

Other findings: No free fluid
IMPRESSION: 1.  Right ovary upper limits of normal in size containing dominant
slightly complex 2.9 cm cyst.  Especially if clinical symptoms
persist or progress, consider transvaginal pelvic ultrasound in 2-3
months for further assessment.
2.  Otherwise, normal.

## 2016-04-15 DIAGNOSIS — O139 Gestational [pregnancy-induced] hypertension without significant proteinuria, unspecified trimester: Secondary | ICD-10-CM | POA: Insufficient documentation

## 2017-09-09 DIAGNOSIS — F53 Postpartum depression: Secondary | ICD-10-CM | POA: Insufficient documentation

## 2018-09-09 DIAGNOSIS — F17210 Nicotine dependence, cigarettes, uncomplicated: Secondary | ICD-10-CM | POA: Insufficient documentation

## 2018-09-09 DIAGNOSIS — E669 Obesity, unspecified: Secondary | ICD-10-CM | POA: Insufficient documentation

## 2018-09-09 DIAGNOSIS — Z6834 Body mass index (BMI) 34.0-34.9, adult: Secondary | ICD-10-CM | POA: Insufficient documentation

## 2018-09-09 DIAGNOSIS — E66811 Obesity, class 1: Secondary | ICD-10-CM | POA: Insufficient documentation

## 2018-09-10 DIAGNOSIS — E781 Pure hyperglyceridemia: Secondary | ICD-10-CM | POA: Insufficient documentation

## 2018-11-14 ENCOUNTER — Encounter: Payer: Self-pay | Admitting: Podiatry

## 2018-11-14 ENCOUNTER — Ambulatory Visit (INDEPENDENT_AMBULATORY_CARE_PROVIDER_SITE_OTHER): Payer: Commercial Managed Care - PPO | Admitting: Podiatry

## 2018-11-14 ENCOUNTER — Other Ambulatory Visit: Payer: Self-pay

## 2018-11-14 ENCOUNTER — Ambulatory Visit: Payer: Commercial Managed Care - PPO

## 2018-11-14 DIAGNOSIS — M6740 Ganglion, unspecified site: Secondary | ICD-10-CM

## 2018-11-14 DIAGNOSIS — M79672 Pain in left foot: Secondary | ICD-10-CM

## 2018-11-14 NOTE — Progress Notes (Signed)
   Subjective:    Patient ID: Carol Castillo, female    DOB: 09/23/1985, 33 y.o.   MRN: [id number]  HPI    Review of Systems  All other systems reviewed and are negative.      Objective:   Physical Exam        Assessment & Plan:

## 2018-11-20 NOTE — Progress Notes (Signed)
Subjective:   Patient ID: Carol Castillo, female   DOB: 33 y.o.   MRN: [id number]   HPI Patient presents with a mass on the lateral side left foot that is been present around 4 months and she does not remember injury associated with it.  States it does get sore at times and she is concerned about its growth.  Patient does not smoke likes to be active   Review of Systems  All other systems reviewed and are negative.       Objective:  Physical Exam Vitals signs and nursing note reviewed.  Constitutional:      Appearance: She is well-developed.  Pulmonary:     Effort: Pulmonary effort is normal.  Musculoskeletal: Normal range of motion.  Skin:    General: Skin is warm.  Neurological:     Mental Status: She is alert.     Neurovascular status intact muscle strength was found to be adequate range of motion within normal limits with patient noted to have a mass of the left lateral foot measuring about 2 x 2 centimeter that is freely movable within subcutaneous tissue and moderately painful     Assessment:  Probability for ganglionic cyst left with pain     Plan:  H&P condition reviewed x-ray reviewed.  Today I did sterile prep around the area and using sterile technique I did aspirate this getting out approximately 1 cc of gelatinous fluid and applied compression with instructions for compression in future.  Discussed possible excision in future  X-rays indicate that there is no signs of calcification or ostial lysis associated with mass

## 2019-05-09 DIAGNOSIS — E559 Vitamin D deficiency, unspecified: Secondary | ICD-10-CM | POA: Insufficient documentation

## 2020-05-22 ENCOUNTER — Ambulatory Visit: Payer: Commercial Managed Care - PPO | Admitting: Podiatry

## 2020-05-29 ENCOUNTER — Ambulatory Visit: Payer: Commercial Managed Care - PPO | Admitting: Podiatry

## 2020-09-10 LAB — HM PAP SMEAR: HM Pap smear: NORMAL

## 2021-01-06 ENCOUNTER — Encounter: Payer: Self-pay | Admitting: Medical-Surgical

## 2021-01-06 ENCOUNTER — Ambulatory Visit (INDEPENDENT_AMBULATORY_CARE_PROVIDER_SITE_OTHER): Payer: Commercial Managed Care - PPO | Admitting: Medical-Surgical

## 2021-01-06 ENCOUNTER — Other Ambulatory Visit: Payer: Self-pay

## 2021-01-06 VITALS — BP 110/75 | HR 86 | Resp 20 | Ht 63.0 in | Wt 187.0 lb

## 2021-01-06 DIAGNOSIS — Z720 Tobacco use: Secondary | ICD-10-CM

## 2021-01-06 DIAGNOSIS — Z7689 Persons encountering health services in other specified circumstances: Secondary | ICD-10-CM | POA: Diagnosis not present

## 2021-01-06 DIAGNOSIS — F419 Anxiety disorder, unspecified: Secondary | ICD-10-CM

## 2021-01-06 MED ORDER — BUPROPION HCL ER (XL) 300 MG PO TB24: 300.0000 mg | ORAL_TABLET | Freq: Every day | ORAL | 1 refills | Status: DC

## 2021-01-06 NOTE — Progress Notes (Signed)
New Patient Office Visit  Subjective:  Patient ID: Carol Castillo, female    DOB: 10/02/1985  Age: 35 y.o. MRN: [id number]  CC:  Chief Complaint  Patient presents with   Establish Care     HPI Carol Castillo presents to establish care.  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  She is also involved to a 50-year-old set of twins.  Currently being treated with Wellbutrin 150 mg and Cymbalta 30 mg nightly.  Tolerating both medications well without side effects.  Feels this keeps her mood fairly well controlled.  She is a current smoker and has quit multiple times over the years.  She is working on cutting back and notes that culture key is the best option for her.  She is planning on quitting while her husband cuts back.  Notes that when she does cut back on smoking, she does tend to eat more and snacks more often.  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  Past Medical History:  Diagnosis Date   Anxiety    Genital warts    Treated with Aldara    Past Surgical History:  Procedure Laterality Date   CERVICAL BIOPSY  08/01/2010   Low Grade Squamous Intraepithelial lesion, CIN-1 (mild dysplasia)   CERVICAL BIOPSY  W/ LOOP ELECTRODE EXCISION  02/10/2004   Moderate Dysplasia   CESAREAN SECTION      Family History  Problem Relation Age of Onset   Hypertension Father    Cancer Father    Cancer Sister     Social History   Socioeconomic History   Marital status: Single    Spouse name: Not on file   Number of children: Not on file   Years of education: Not on file   Highest education level: Not on file  Occupational History   Not on file  Tobacco Use   Smoking status: Every Day    Packs/day: 0.50    Years: 20.00    Pack years: 10.00    Types: Cigarettes   Smokeless tobacco: Never  Substance and Sexual Activity   Alcohol use: Yes     Comment: rarely   Drug use: Never   Sexual activity: Not on file  Other Topics Concern   Not on file  Social History Narrative   Not on file   Social Determinants of Health   Financial Resource Strain: Not on file  Food Insecurity: Not on file  Transportation Needs: Not on file  Physical Activity: Not on file  Stress: Not on file  Social Connections: Not on file  Intimate Partner Violence: Not on file    ROS Review of Systems  Constitutional:  Negative for chills, fatigue, fever and unexpected weight change.  HENT:  Negative for congestion, rhinorrhea, sinus pressure and sore throat.   Respiratory:  Negative for cough, chest tightness and shortness of breath.   Cardiovascular:  Negative for chest pain, palpitations and leg swelling.  Gastrointestinal:  Negative for abdominal pain, constipation, diarrhea, nausea and vomiting.  Endocrine: Negative for cold intolerance and heat intolerance.  Genitourinary:  Negative for dysuria, frequency and urgency.  Skin:  Negative for rash and wound.  Neurological:  Negative for dizziness, light-headedness and headaches.  Hematological:  Does not bruise/bleed easily.  Psychiatric/Behavioral:  Positive for dysphoric mood. Negative for self-injury, sleep disturbance and suicidal ideas. The patient is  nervous/anxious.    Objective:   Today's Vitals: BP 110/75   Pulse 86   Resp 20   Ht 5\' 3"  (1.6 m)   Wt 187 lb (84.8 kg)   SpO2 98%   BMI 33.13 kg/m   Physical Exam Vitals reviewed.  Constitutional:      General: She is not in acute distress.    Appearance: Normal appearance. She is obese. She is not ill-appearing.  HENT:     Head: Normocephalic and atraumatic.  Cardiovascular:     Rate and Rhythm: Normal rate and regular rhythm.     Pulses: Normal pulses.     Heart sounds: Normal heart sounds. No murmur heard.   No friction rub. No gallop.  Pulmonary:     Effort: Pulmonary effort is normal. No respiratory distress.     Breath  sounds: Normal breath sounds. No wheezing.  Skin:    General: Skin is warm and dry.  Neurological:     Mental Status: She is alert and oriented to person, place, and time.  Psychiatric:        Mood and Affect: Mood normal.        Behavior: Behavior normal.        Thought Content: Thought content normal.        Judgment: Judgment normal.    Assessment & Plan:   1. Encounter to establish care Reviewed available information and discussed care concerns with patient.   2. Anxiety Continue Cymbalta 30 mg nightly.  Increasing Wellbutrin to 300 mg nightly.  3. Tobacco user Increasing Wellbutrin to see if this will help with smoking cessation as well as eating for comfort while she tries to quit.  Outpatient Encounter Medications as of 01/06/2021  Medication Sig   Ascorbic Acid (VITAMIN C) 100 MG tablet Take by mouth.   Biotin 01/08/2021 MCG TABS Take 10,000 mcg by mouth daily.   Cobalamin Combinations (B12 FOLATE PO) Take by mouth.   DULoxetine (CYMBALTA) 30 MG capsule Take 30 mg by mouth at bedtime.   levonorgestrel (MIRENA) 20 MCG/24HR IUD by Intrauterine route.   OMEGA-3-ACID ETH EST, DIETARY, PO Take 2,400 mg by mouth at bedtime.   SUMAtriptan (IMITREX) 50 MG tablet TAKE 1 TABLET BY MOUTH NOW REPEAT ONCE IN TWO HOURS IF NEEDED   Vitamin D, Cholecalciferol, 25 MCG (1000 UT) TABS Take by mouth.   Zinc 100 MG TABS Take by mouth.   [DISCONTINUED] buPROPion (WELLBUTRIN XL) 150 MG 24 hr tablet Take 1 tab PO daily   buPROPion (WELLBUTRIN XL) 300 MG 24 hr tablet Take 1 tablet (300 mg total) by mouth daily.   [DISCONTINUED] ALPRAZolam (XANAX) 0.25 MG tablet TAKE 1 TABLET BY MOUTH TWICE DAILY AS NEEDED FOR SLEEP   [DISCONTINUED] omega-3 acid ethyl esters (LOVAZA) 1 g capsule Take by mouth.   [DISCONTINUED] venlafaxine XR (EFFEXOR-XR) 75 MG 24 hr capsule Take 75 mg by mouth daily.   No facility-administered encounter medications on file as of 01/06/2021.    Follow-up: Return in about 6 weeks  (around 02/17/2021) for mood follow up.   04/17/2021, DNP, APRN, FNP-BC [facility]

## 2021-01-10 ENCOUNTER — Encounter: Payer: Self-pay | Admitting: Medical-Surgical

## 2021-01-27 ENCOUNTER — Encounter: Payer: Self-pay | Admitting: Medical-Surgical

## 2021-05-26 ENCOUNTER — Encounter: Payer: Self-pay | Admitting: Medical-Surgical

## 2021-05-26 MED ORDER — DULOXETINE HCL 30 MG PO CPEP: 30.0000 mg | ORAL_CAPSULE | Freq: Every evening | ORAL | 1 refills | Status: DC

## 2021-07-17 ENCOUNTER — Ambulatory Visit (INDEPENDENT_AMBULATORY_CARE_PROVIDER_SITE_OTHER): Payer: Commercial Managed Care - PPO

## 2021-07-17 ENCOUNTER — Ambulatory Visit (INDEPENDENT_AMBULATORY_CARE_PROVIDER_SITE_OTHER): Payer: Commercial Managed Care - PPO | Admitting: Podiatry

## 2021-07-17 ENCOUNTER — Encounter: Payer: Self-pay | Admitting: Podiatry

## 2021-07-17 DIAGNOSIS — M722 Plantar fascial fibromatosis: Secondary | ICD-10-CM

## 2021-07-17 DIAGNOSIS — M6740 Ganglion, unspecified site: Secondary | ICD-10-CM | POA: Diagnosis not present

## 2021-07-17 DIAGNOSIS — M779 Enthesopathy, unspecified: Secondary | ICD-10-CM

## 2021-07-17 DIAGNOSIS — M205X1 Other deformities of toe(s) (acquired), right foot: Secondary | ICD-10-CM | POA: Diagnosis not present

## 2021-07-17 MED ORDER — DICLOFENAC SODIUM 75 MG PO TBEC: 75.0000 mg | DELAYED_RELEASE_TABLET | Freq: Two times a day (BID) | ORAL | 2 refills | Status: DC

## 2021-07-17 MED ORDER — TRIAMCINOLONE ACETONIDE 10 MG/ML IJ SUSP
10.0000 mg | Freq: Once | INTRAMUSCULAR | Status: AC
  Administered 2021-07-17: 10 mg

## 2021-07-17 NOTE — Progress Notes (Signed)
Subjective:   Patient ID: Carol Castillo, female   DOB: 36 y.o.   MRN: [id number]   HPI Patient presents with exquisite discomfort in the right heel stating its been at least 2 years but longer and worse over the last year and it is very bad now she would like to run has not been able to and states the pain can be quite debilitating recently.  Also complains about possible cyst of the left forefoot on the lateral side that is only mildly tender and is better than when we worked on it a number of years ago that she wants it checked and discussed.  Does smoke half pack cigarettes per day   ROS      Objective:  Physical Exam  Neurovascular status intact with exquisite discomfort noted plantar aspect right heel at the insertional point tendon calcaneus with inflammation fluid around the medial band and moderate cyst formation of the left lateral foot mildly discomforting but not severe     Assessment:  Acute Planter fasciitis right with inflammation fluid buildup along with ganglion on the left that is slightly there but improved from previous     Plan:  H&P reviewed both conditions and for the right I went ahead did sterile prep and injected the fascia 3 mg Kenalog 5 mg Xylocaine applied fascial brace with instructions on usage and placed on oral anti-inflammatory gave instructions for stretching and discussed future things we may need to do with this with possibility for orthotics or splinting.  For the left we discussed and I do not recommend current treatment except for hot cold packs and may require drainage again but does not seem to be necessary currently  X-rays taken indicate plantar heel spur formation no indications of stress fracture calcification of the left lateral foot

## 2021-07-17 NOTE — Patient Instructions (Signed)

## 2021-08-01 ENCOUNTER — Ambulatory Visit (INDEPENDENT_AMBULATORY_CARE_PROVIDER_SITE_OTHER): Payer: Commercial Managed Care - PPO | Admitting: *Deleted

## 2021-08-01 ENCOUNTER — Encounter: Payer: Self-pay | Admitting: Podiatry

## 2021-08-01 ENCOUNTER — Ambulatory Visit (INDEPENDENT_AMBULATORY_CARE_PROVIDER_SITE_OTHER): Payer: Commercial Managed Care - PPO | Admitting: Podiatry

## 2021-08-01 DIAGNOSIS — M7672 Peroneal tendinitis, left leg: Secondary | ICD-10-CM

## 2021-08-01 DIAGNOSIS — M722 Plantar fascial fibromatosis: Secondary | ICD-10-CM

## 2021-08-01 MED ORDER — TRIAMCINOLONE ACETONIDE 10 MG/ML IJ SUSP
10.0000 mg | Freq: Once | INTRAMUSCULAR | Status: AC
  Administered 2021-08-01: 10 mg

## 2021-08-07 ENCOUNTER — Encounter: Payer: Self-pay | Admitting: Podiatry

## 2021-08-09 NOTE — Telephone Encounter (Signed)
Dawn, can you please assist? Thanks.

## 2021-08-13 ENCOUNTER — Other Ambulatory Visit: Payer: Self-pay

## 2021-08-13 DIAGNOSIS — Z0000 Encounter for general adult medical examination without abnormal findings: Secondary | ICD-10-CM

## 2021-08-14 ENCOUNTER — Encounter: Payer: Self-pay | Admitting: Podiatry

## 2021-08-14 ENCOUNTER — Ambulatory Visit (INDEPENDENT_AMBULATORY_CARE_PROVIDER_SITE_OTHER): Payer: Commercial Managed Care - PPO | Admitting: Podiatry

## 2021-08-14 DIAGNOSIS — M722 Plantar fascial fibromatosis: Secondary | ICD-10-CM

## 2021-08-14 DIAGNOSIS — T148XXA Other injury of unspecified body region, initial encounter: Secondary | ICD-10-CM

## 2021-08-15 NOTE — Progress Notes (Signed)
Subjective:   Patient ID: Carol Castillo, female   DOB: 36 y.o.   MRN: [id number]   HPI Patient [place] the [place] and she was walking in the sand and had an abnormal movement of her foot and felt a pop and its been severely tender since then.  States she is having trouble walking down on it   ROS      Objective:  Physical Exam  Neurovascular status was found to be intact with patient found to have exquisite discomfort in the right plantar fascia and distal to this point.  When comparing the 2 fascial bands it does appear there is been partial tear of the medial fascial band at its insertion calcaneus and distal to this point with exquisite discomfort noted     Assessment:  Probability for torn plantar fascial tendon right     Plan:  Reviewed condition and recommended immobilization due to the intensity of discomfort today.  I went ahead today I applied an air fracture walker with all instructions on usage along with ice therapy and reduced activity.  Reappoint 3 weeks I did explain hopefully that this will prevent her from needing surgery on this and there is a good chance that this will resolve but we do not know yet until I can see how it responds to immobilization  X-rays indicate no signs of fracture or bone spur formation currently

## 2021-08-19 ENCOUNTER — Ambulatory Visit: Payer: Commercial Managed Care - PPO | Admitting: Podiatry

## 2021-08-26 ENCOUNTER — Other Ambulatory Visit: Payer: Self-pay | Admitting: Medical-Surgical

## 2021-08-26 ENCOUNTER — Encounter: Payer: Self-pay | Admitting: Medical-Surgical

## 2021-08-29 ENCOUNTER — Ambulatory Visit (INDEPENDENT_AMBULATORY_CARE_PROVIDER_SITE_OTHER): Payer: Commercial Managed Care - PPO

## 2021-08-29 DIAGNOSIS — M722 Plantar fascial fibromatosis: Secondary | ICD-10-CM

## 2021-08-29 NOTE — Progress Notes (Signed)
Patient presents today to pick up custom molded foot orthotics, diagnosed with plantar fasciitis by Dr. Charlsie Merles.   Orthotics were dispensed and fit was satisfactory. Reviewed instructions for break-in and wear. Written instructions given to patient.  Patient will follow up as needed.   [facility] - order # [id number]

## 2021-09-09 LAB — COMPLETE METABOLIC PANEL WITH GFR
AG Ratio: 2 (calc) (ref 1.0–2.5)
ALT: 23 U/L (ref 6–29)
AST: 19 U/L (ref 10–30)
Albumin: 4.3 g/dL (ref 3.6–5.1)
Alkaline phosphatase (APISO): 75 U/L (ref 31–125)
BUN: 11 mg/dL (ref 7–25)
CO2: 28 mmol/L (ref 20–32)
Calcium: 9 mg/dL (ref 8.6–10.2)
Chloride: 102 mmol/L (ref 98–110)
Creat: 0.86 mg/dL (ref 0.50–0.97)
Globulin: 2.1 g/dL (calc) (ref 1.9–3.7)
Glucose, Bld: 87 mg/dL (ref 65–99)
Potassium: 4.2 mmol/L (ref 3.5–5.3)
Sodium: 137 mmol/L (ref 135–146)
Total Bilirubin: 0.3 mg/dL (ref 0.2–1.2)
Total Protein: 6.4 g/dL (ref 6.1–8.1)
eGFR: 89 mL/min/{1.73_m2} (ref >=60)

## 2021-09-09 LAB — HEMOGLOBIN A1C
Hgb A1c MFr Bld: 4.9 % of total Hgb (ref <5.7)
Mean Plasma Glucose: 94 mg/dL
eAG (mmol/L): 5.2 mmol/L

## 2021-09-09 LAB — CBC WITH DIFFERENTIAL/PLATELET
Absolute Monocytes: 340 cells/uL (ref 200–950)
Basophils Absolute: 19 cells/uL (ref 0–200)
Basophils Relative: 0.3 %
Eosinophils Absolute: 69 cells/uL (ref 15–500)
Eosinophils Relative: 1.1 %
HCT: 41.7 % (ref 35.0–45.0)
Hemoglobin: 14 g/dL (ref 11.7–15.5)
Lymphs Abs: 2129 cells/uL (ref 850–3900)
MCH: 30.3 pg (ref 27.0–33.0)
MCHC: 33.6 g/dL (ref 32.0–36.0)
MCV: 90.3 fL (ref 80.0–100.0)
MPV: 9.6 fL (ref 7.5–12.5)
Monocytes Relative: 5.4 %
Neutro Abs: 3742 cells/uL (ref 1500–7800)
Neutrophils Relative %: 59.4 %
Platelets: 249 10*3/uL (ref 140–400)
RBC: 4.62 10*6/uL (ref 3.80–5.10)
RDW: 12.2 % (ref 11.0–15.0)
Total Lymphocyte: 33.8 %
WBC: 6.3 10*3/uL (ref 3.8–10.8)

## 2021-09-09 LAB — LIPID PANEL
Cholesterol: 158 mg/dL (ref <200)
HDL: 41 mg/dL — ABNORMAL LOW (ref >=50)
LDL Cholesterol (Calc): 89 mg/dL (calc)
Non-HDL Cholesterol (Calc): 117 mg/dL (calc) (ref <130)
Total CHOL/HDL Ratio: 3.9 (calc) (ref <5.0)
Triglycerides: 185 mg/dL — ABNORMAL HIGH (ref <150)

## 2021-09-09 LAB — TSH: TSH: 1.85 mIU/L

## 2021-09-11 ENCOUNTER — Encounter: Payer: Self-pay | Admitting: Medical-Surgical

## 2021-09-11 ENCOUNTER — Ambulatory Visit (INDEPENDENT_AMBULATORY_CARE_PROVIDER_SITE_OTHER): Payer: Commercial Managed Care - PPO | Admitting: Medical-Surgical

## 2021-09-11 VITALS — BP 142/99 | HR 97 | Resp 20 | Ht 63.0 in | Wt 187.9 lb

## 2021-09-11 DIAGNOSIS — Z720 Tobacco use: Secondary | ICD-10-CM

## 2021-09-11 DIAGNOSIS — Z0000 Encounter for general adult medical examination without abnormal findings: Secondary | ICD-10-CM

## 2021-09-11 DIAGNOSIS — E781 Pure hyperglyceridemia: Secondary | ICD-10-CM | POA: Diagnosis not present

## 2021-09-11 NOTE — Progress Notes (Signed)
Complete physical exam  Patient: Carol Castillo   DOB: May 29, 1985   36 y.o. Female  MRN: [id number]  Subjective:    Chief Complaint  Patient presents with   Annual Exam    Carol Castillo is a 36 y.o. female who presents today for a complete physical exam. She reports consuming a general diet. Exercise is limited by orthopedic condition(s): recent plantar fascia tear. She generally feels well. She reports sleeping well. She does not have additional problems to discuss today.    Most recent fall risk assessment:    09/11/2021    3:49 PM  [place] in the past year? 0  Number falls in past yr: 0  Injury with Fall? 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   Ascorbic Acid (VITAMIN C) 100 MG tablet Take by mouth.   Biotin 10000 MCG TABS Take 10,000 mcg by mouth daily.   buPROPion (WELLBUTRIN XL) 300 MG 24 hr tablet Take 1 tablet by mouth once daily   Cobalamin Combinations (B12 FOLATE PO) Take by mouth.   DULoxetine (CYMBALTA) 30 MG capsule Take 1 capsule (30 mg total) by mouth at bedtime.   levonorgestrel (MIRENA) 20 MCG/24HR IUD by Intrauterine route.   OMEGA-3-ACID ETH EST, DIETARY, PO Take 2,400 mg by mouth at bedtime.   SUMAtriptan (IMITREX) 50 MG tablet TAKE 1 TABLET BY MOUTH NOW REPEAT ONCE IN TWO HOURS IF NEEDED   Vitamin D, Cholecalciferol, 25 MCG (1000 UT)  TABS Take by mouth.   Zinc 100 MG TABS Take by mouth.   [DISCONTINUED] clotrimazole-betamethasone (LOTRISONE) cream Apply topically 2 (two) times daily.   [DISCONTINUED] diclofenac (VOLTAREN) 75 MG EC tablet Take 1 tablet (75 mg total) by mouth 2 (two) times daily.   No facility-administered medications prior to visit.    Review of Systems  Constitutional:  Negative for chills, fever, malaise/fatigue and weight loss.  HENT:  Negative for congestion, ear pain, hearing loss, sinus pain and sore throat.   Eyes:  Negative for blurred vision, photophobia and pain.  Respiratory:  Negative for cough, shortness of breath and wheezing.   Cardiovascular:  Negative for chest pain, palpitations and leg swelling.  Gastrointestinal:  Negative for abdominal pain, constipation, diarrhea, heartburn, nausea and vomiting.  Genitourinary:  Negative for dysuria, frequency and urgency.  Musculoskeletal:  Negative for falls and neck pain.  Skin:  Negative for itching and rash.  Neurological:  Negative for dizziness, weakness and headaches.  Endo/Heme/Allergies:  Negative for polydipsia. Does not bruise/bleed easily.  Psychiatric/Behavioral:  Negative for depression, substance abuse and suicidal ideas. The patient is not nervous/anxious.      Objective:    BP (!) 142/99 (BP Location: Right Arm, Cuff Size: Normal)   Pulse 97   Resp 20  Ht 5' 3" (1.6 m)   Wt 187 lb 14.4 oz (85.2 kg)   SpO2 98%   BMI 33.28 kg/m    Physical Exam Constitutional:      General: She is not in acute distress.    Appearance: Normal appearance. She is obese. She is not ill-appearing.  HENT:     Head: Normocephalic and atraumatic.     Right Ear: Tympanic membrane, ear canal and external ear normal. There is no impacted cerumen.     Left Ear: Tympanic membrane, ear canal and external ear normal. There is no impacted cerumen.     Nose: Nose normal.     Mouth/Throat:     Mouth: Mucous membranes are moist.     Pharynx: No  oropharyngeal exudate or posterior oropharyngeal erythema.  Eyes:     General: No scleral icterus.       Right eye: No discharge.        Left eye: No discharge.     Extraocular Movements: Extraocular movements intact.     Conjunctiva/sclera: Conjunctivae normal.     Pupils: Pupils are equal, round, and reactive to light.  Neck:     Thyroid: No thyromegaly.     Vascular: No carotid bruit or JVD.     Trachea: Trachea normal.  Cardiovascular:     Rate and Rhythm: Normal rate and regular rhythm.     Pulses: Normal pulses.     Heart sounds: Normal heart sounds. No murmur heard.    No friction rub. No gallop.  Pulmonary:     Effort: Pulmonary effort is normal. No respiratory distress.     Breath sounds: Normal breath sounds. No wheezing.  Abdominal:     General: Bowel sounds are normal. There is no distension.     Palpations: Abdomen is soft.     Tenderness: There is no abdominal tenderness. There is no guarding.  Musculoskeletal:        General: Normal range of motion.     Cervical back: Normal range of motion and neck supple.  Skin:    General: Skin is warm and dry.  Neurological:     Mental Status: She is alert and oriented to person, place, and time.     Cranial Nerves: No cranial nerve deficit.  Psychiatric:        Mood and Affect: Mood normal.        Behavior: Behavior normal.        Thought Content: Thought content normal.        Judgment: Judgment normal.   No results found for any visits on 09/11/21.     Assessment & Plan:    Routine Health Maintenance and Physical Exam  Immunization History  Administered Date(s) Administered   DTaP 01/27/1984, 03/31/1984, 05/27/1984, 08/25/1985, 01/20/1989   HPV Quadrivalent 07/10/2010, 09/13/2010, 01/24/2011   Hepatitis B, ped/adol 10/18/1991, 11/22/1991, 04/19/1992   Influenza Split 11/16/2012   Influenza, Seasonal, Injecte, Preservative Fre 11/12/2016   Influenza,inj,Quad PF,6-35 Mos 10/22/2018   Influenza-Unspecified  10/22/2018, 11/09/2020   MMR 02/27/1985, 08/07/1994   Meningococcal Conjugate 11/16/2002   PPD Test 06/08/2012, 11/18/2012   Td 09/17/1995   Tdap 03/27/2005, 04/15/2016    Health Maintenance  Topic Date Due   HIV Screening  Never done   Hepatitis C Screening  Never done   INFLUENZA VACCINE  10/12/2021 (Originally 09/09/2021)   PAP SMEAR-Modifier  09/10/2025   TETANUS/TDAP  04/16/2026   HPV VACCINES  Completed   COVID-19 Vaccine  Discontinued      Discussed health benefits of physical activity, and encouraged her to engage in regular exercise appropriate for her age and condition.  1. Annual physical exam Recent labs reviewed and questions answered.  Up-to-date on preventative care.  Wellness information provided with AVS.  2. Tobacco user Notes that she is very anxious regarding possible cancer.  She was a smoker for 20 years off and on, smoking 1/2-1 pack of cigarettes per day.  Understands the benefits of smoking cessation and reports that she has quit at this time.  3.  Hypertriglyceridemia Cholesterol results reviewed.  Her HDL is a bit low so recommend working on weight loss, exercise, and a low-fat heart healthy diet.  Return in about 1 year (around 09/12/2022) for annual physical exam.    , NP   

## 2021-09-16 ENCOUNTER — Encounter: Payer: Self-pay | Admitting: Medical-Surgical

## 2021-09-17 MED ORDER — ALPRAZOLAM 0.25 MG PO TABS: 0.2500 mg | ORAL_TABLET | Freq: Every day | ORAL | 0 refills | Status: AC | PRN

## 2021-11-24 ENCOUNTER — Other Ambulatory Visit: Payer: Self-pay | Admitting: Medical-Surgical

## 2021-11-28 ENCOUNTER — Encounter: Payer: Self-pay | Admitting: Medical-Surgical

## 2021-12-02 ENCOUNTER — Other Ambulatory Visit: Payer: Self-pay | Admitting: Medical-Surgical

## 2022-02-23 ENCOUNTER — Ambulatory Visit (INDEPENDENT_AMBULATORY_CARE_PROVIDER_SITE_OTHER): Payer: Commercial Managed Care - PPO

## 2022-02-23 ENCOUNTER — Encounter: Payer: Self-pay | Admitting: Medical-Surgical

## 2022-02-23 ENCOUNTER — Ambulatory Visit (INDEPENDENT_AMBULATORY_CARE_PROVIDER_SITE_OTHER): Payer: Commercial Managed Care - PPO | Admitting: Medical-Surgical

## 2022-02-23 VITALS — BP 129/84 | HR 91 | Resp 20 | Ht 63.0 in | Wt 192.9 lb

## 2022-02-23 DIAGNOSIS — R1030 Lower abdominal pain, unspecified: Secondary | ICD-10-CM

## 2022-02-23 DIAGNOSIS — R42 Dizziness and giddiness: Secondary | ICD-10-CM

## 2022-02-23 DIAGNOSIS — F419 Anxiety disorder, unspecified: Secondary | ICD-10-CM | POA: Diagnosis not present

## 2022-02-23 MED ORDER — DULOXETINE HCL 60 MG PO CPEP: 60.0000 mg | ORAL_CAPSULE | Freq: Every day | ORAL | 1 refills | Status: DC

## 2022-02-23 MED ORDER — MECLIZINE HCL 25 MG PO TABS: 25.0000 mg | ORAL_TABLET | Freq: Three times a day (TID) | ORAL | 0 refills | Status: DC | PRN

## 2022-02-23 NOTE — Progress Notes (Signed)
Established Patient Office Visit  Subjective   Patient ID: Carol Castillo, female   DOB: [birth year] Age: 37 y.o. MRN: [id number]   Chief Complaint  Patient presents with   Abdominal Pain   HPI Very pleasant 37 year old female presenting today with reports of concerning symptoms since December 1.  Notes that on the first, she and her husband had intercourse and immediately after, she began to develop severe lower abdominal cramps with nausea and lower abdominal pressure that lasted approximately 4 days.  By the second week of December, she noted that her abdomen was very bloated and she had a heavy feeling down low.  She notes that her bowel movements have become abnormal and they are minimal with soft stools.  She has recently increased her water, fruit, and vegetable intake since she started weight watchers but does not feel that this is what caused the bowel function changes.  Last Sunday, she developed head pressure as a feeling of motion sickness that included nausea and dizziness on Saturday couple of days ago these symptoms happened again.  She thought this may be related to her history of migraines her head never hurt she only had aura and pressure today, she reports the nausea is gone.  She does still have some intermittent dizziness and continues to have the pressure all around her forehead and the back of her head.  Her lower abdomen still feels very heavy.  Her IUD strings are notably longer than usual however she is unable to feel the IUD itself.  Denies fever, chills, chest pain, shortness of breath, vomiting, melena, hematochezia, mucousy stools, recent sick contacts, and vaginal symptoms.   Mood: Notes that her husband recently got a promotion which puts him back out on the streets as a Primary school teacher.  He was friends with sergeant Nix who was recently killed and then had to go back to control the very next day.  This has caused her significantly higher stress and worry levels.  Taking  Cymbalta 30 mg daily and Wellbutrin 300 mg daily.  Has Xanax to use for severe anxiety/panic attacks but seldom takes this.  Wonders if her stress and anxiety levels may be causing some of her symptoms. Objective:    Vitals:   02/23/22 0825  BP: 129/84  Pulse: 91  Resp: 20  Height: 5\' 3"  (1.6 m)  Weight: 192 lb 14.4 oz (87.5 kg)  SpO2: 99%  BMI (Calculated): 34.18    Physical Exam Vitals and nursing note reviewed.  Constitutional:      General: She is not in acute distress.    Appearance: Normal appearance. She is well-developed. She is not ill-appearing.  HENT:     Head: Normocephalic and atraumatic.  Cardiovascular:     Rate and Rhythm: Normal rate and regular rhythm.     Pulses: Normal pulses.     Heart sounds: Normal heart sounds.  Pulmonary:     Effort: Pulmonary effort is normal. No respiratory distress.     Breath sounds: Normal breath sounds. No wheezing, rhonchi or rales.  Abdominal:     General: Bowel sounds are normal. There is no distension.     Palpations: Abdomen is soft. There is no hepatomegaly, splenomegaly or mass.     Tenderness: There is abdominal tenderness (Bilateral lower quadrants). There is no guarding or rebound.     Hernia: No hernia is present.  Skin:    General: Skin is warm and dry.  Neurological:     Mental Status: She  is alert and oriented to person, place, and time.     Cranial Nerves: No cranial nerve deficit.  Psychiatric:        Mood and Affect: Mood is anxious (Mildly).        Behavior: Behavior normal.        Thought Content: Thought content normal.        Judgment: Judgment normal.   No results found for this or any previous visit (from the past 24 hour(s)).     The ASCVD Risk score (Arnett DK, et al., 2019) failed to calculate for the following reasons:   The 2019 ASCVD risk score is only valid for ages 42 to 42   Assessment & Plan:   1. Lower abdominal pain Unclear etiology.  Mild lower abdominal tenderness no other exam  findings to help pinpoint cause.  Consider constipation versus ovarian cyst versus IUD placement change.  Getting abdominal x-ray today.  Ordering ultrasound of the pelvis for further investigation with recent IUD string changes.  Checking labs as below.  If all results come back normal, consider adding stool studies. - DG Abd 1 View; Future - US Pelvic Complete With Transvaginal; Future - CBC with Differential - TSH - COMPLETE METABOLIC PANEL WITH GFR  2. Anxiety Some possible relation to the head pressure, dizziness, and abdominal discomfort.  She has been on her current regimen for a while and would like to try something a little different.  Increasing Cymbalta 60 mg nightly.  Continue Wellbutrin 300 mg daily.  Continue very sparing use of Xanax only for severe anxiety.  3. Nonspecific dizziness Adding meclizine 25 mg 3 times daily as needed to help with dizziness and nausea.   Return in about 6 weeks (around 04/06/2022) for mood follow up.  ___________________________________________ Clearnce Sorrel, DNP, APRN, FNP-BC [facility] [place]

## 2022-02-24 LAB — CBC WITH DIFFERENTIAL/PLATELET
Absolute Monocytes: 271 cells/uL (ref 200–950)
Basophils Absolute: 18 cells/uL (ref 0–200)
Basophils Relative: 0.3 %
Eosinophils Absolute: 77 cells/uL (ref 15–500)
Eosinophils Relative: 1.3 %
HCT: 41.8 % (ref 35.0–45.0)
Hemoglobin: 14.3 g/dL (ref 11.7–15.5)
Lymphs Abs: 2130 cells/uL (ref 850–3900)
MCH: 29.9 pg (ref 27.0–33.0)
MCHC: 34.2 g/dL (ref 32.0–36.0)
MCV: 87.3 fL (ref 80.0–100.0)
MPV: 10 fL (ref 7.5–12.5)
Monocytes Relative: 4.6 %
Neutro Abs: 3404 cells/uL (ref 1500–7800)
Neutrophils Relative %: 57.7 %
Platelets: 251 10*3/uL (ref 140–400)
RBC: 4.79 10*6/uL (ref 3.80–5.10)
RDW: 12.2 % (ref 11.0–15.0)
Total Lymphocyte: 36.1 %
WBC: 5.9 10*3/uL (ref 3.8–10.8)

## 2022-02-24 LAB — COMPLETE METABOLIC PANEL WITH GFR
AG Ratio: 2.4 (calc) (ref 1.0–2.5)
ALT: 18 U/L (ref 6–29)
AST: 18 U/L (ref 10–30)
Albumin: 4.6 g/dL (ref 3.6–5.1)
Alkaline phosphatase (APISO): 71 U/L (ref 31–125)
BUN: 13 mg/dL (ref 7–25)
CO2: 28 mmol/L (ref 20–32)
Calcium: 9.6 mg/dL (ref 8.6–10.2)
Chloride: 106 mmol/L (ref 98–110)
Creat: 0.79 mg/dL (ref 0.50–0.97)
Globulin: 1.9 g/dL (calc) (ref 1.9–3.7)
Glucose, Bld: 86 mg/dL (ref 65–99)
Potassium: 4.6 mmol/L (ref 3.5–5.3)
Sodium: 142 mmol/L (ref 135–146)
Total Bilirubin: 0.4 mg/dL (ref 0.2–1.2)
Total Protein: 6.5 g/dL (ref 6.1–8.1)
eGFR: 98 mL/min/{1.73_m2} (ref >=60)

## 2022-02-24 LAB — TSH: TSH: 1.27 mIU/L

## 2022-04-16 ENCOUNTER — Other Ambulatory Visit: Payer: Self-pay | Admitting: Medical-Surgical

## 2022-06-01 ENCOUNTER — Ambulatory Visit (INDEPENDENT_AMBULATORY_CARE_PROVIDER_SITE_OTHER): Payer: Commercial Managed Care - PPO | Admitting: Physician Assistant

## 2022-06-01 ENCOUNTER — Encounter: Payer: Self-pay | Admitting: Medical-Surgical

## 2022-06-01 VITALS — BP 115/77 | HR 81 | Ht 63.0 in | Wt 192.0 lb

## 2022-06-01 DIAGNOSIS — M79641 Pain in right hand: Secondary | ICD-10-CM | POA: Diagnosis not present

## 2022-06-01 DIAGNOSIS — M25561 Pain in right knee: Secondary | ICD-10-CM | POA: Diagnosis not present

## 2022-06-01 DIAGNOSIS — R2243 Localized swelling, mass and lump, lower limb, bilateral: Secondary | ICD-10-CM

## 2022-06-01 DIAGNOSIS — R768 Other specified abnormal immunological findings in serum: Secondary | ICD-10-CM

## 2022-06-01 DIAGNOSIS — M7989 Other specified soft tissue disorders: Secondary | ICD-10-CM

## 2022-06-01 DIAGNOSIS — M25562 Pain in left knee: Secondary | ICD-10-CM

## 2022-06-01 DIAGNOSIS — M79642 Pain in left hand: Secondary | ICD-10-CM

## 2022-06-01 MED ORDER — PREDNISONE 20 MG PO TABS: ORAL_TABLET | ORAL | 0 refills | Status: DC

## 2022-06-01 NOTE — Progress Notes (Signed)
   Acute Office Visit  Subjective:     Patient ID: Carol Castillo, female    DOB: 03-May-1985, 37 y.o.   MRN: [id number]  No chief complaint on file.   HPI Patient is in today for ***  Saturday swelling hands ache.  Calves swollen Knees   APS in pregnancy no other autoimmune disease Reaction to covid vaccine bells palsy No family hx of autoimmjune  Not been sick recently No changes in meds No fever or chills  No rashes no tick bites  No fever hot flashed  ROS      Objective:    There were no vitals taken for this visit. {Vitals History (Optional):23777}  Physical Exam  No results found for any visits on 06/01/22.      Assessment & Plan:   Problem List Items Addressed This Visit   None   No orders of the defined types were placed in this encounter.   No follow-ups on file.  Tandy Gaw, PA-C

## 2022-06-02 ENCOUNTER — Encounter: Payer: Self-pay | Admitting: Physician Assistant

## 2022-06-02 LAB — COMPLETE METABOLIC PANEL WITH GFR
Calcium: 8.9 mg/dL (ref 8.6–10.2)
Chloride: 107 mmol/L (ref 98–110)
Globulin: 1.9 g/dL (calc) (ref 1.9–3.7)
Glucose, Bld: 77 mg/dL (ref 65–99)
eGFR: 86 mL/min/{1.73_m2} (ref >=60)

## 2022-06-02 LAB — CBC WITH DIFFERENTIAL/PLATELET
Hemoglobin: 12.6 g/dL (ref 11.7–15.5)
Lymphs Abs: 1625 cells/uL (ref 850–3900)
MCV: 85.8 fL (ref 80.0–100.0)
RBC: 4.15 10*6/uL (ref 3.80–5.10)
RDW: 12.2 % (ref 11.0–15.0)

## 2022-06-02 LAB — C-REACTIVE PROTEIN: CRP: <3 mg/L (ref <8.0)

## 2022-06-02 NOTE — Progress Notes (Signed)
Carol Castillo,   Kidney and glucose look great.  ALT up a little.  Sed rate is normal.  CRP is normal.  Thyroid looks great.  Normal hemoglobin.   Still pending many other labs.

## 2022-06-03 LAB — TSH: TSH: 2.09 mIU/L

## 2022-06-03 LAB — SEDIMENTATION RATE: Sed Rate: 9 mm/h (ref 0–20)

## 2022-06-03 LAB — TIER 1
Chromatin (Nucleosomal) Antibody: <1 AI
ENA SM Ab Ser-aCnc: <1 AI

## 2022-06-03 LAB — ANTI-NUCLEAR AB-TITER (ANA TITER)

## 2022-06-03 LAB — COMPLETE METABOLIC PANEL WITH GFR
AG Ratio: 2.2 (calc) (ref 1.0–2.5)
Albumin: 4.2 g/dL (ref 3.6–5.1)
BUN: 15 mg/dL (ref 7–25)
CO2: 27 mmol/L (ref 20–32)
Creat: 0.88 mg/dL (ref 0.50–0.97)

## 2022-06-03 LAB — CBC WITH DIFFERENTIAL/PLATELET
MPV: 9.5 fL (ref 7.5–12.5)
Neutrophils Relative %: 60.4 %
WBC: 5.7 10*3/uL (ref 3.8–10.8)

## 2022-06-03 NOTE — Progress Notes (Signed)
Negative for lymes disease.  Labs still pending.

## 2022-06-04 ENCOUNTER — Encounter: Payer: Self-pay | Admitting: Physician Assistant

## 2022-06-04 ENCOUNTER — Other Ambulatory Visit: Payer: Self-pay

## 2022-06-04 DIAGNOSIS — R768 Other specified abnormal immunological findings in serum: Secondary | ICD-10-CM

## 2022-06-04 NOTE — Progress Notes (Signed)
Carol Castillo,   You have a very low positive antibody level which could be a normal variant but I would like for you to still see rheumatology in consult and make sure no further testing could or should be done. Are you ok with this?

## 2022-06-04 NOTE — Addendum Note (Signed)
Addended by: Jomarie Longs on: 06/04/2022 10:56 AM   Modules accepted: Orders

## 2022-06-05 LAB — CBC WITH DIFFERENTIAL/PLATELET
Absolute Monocytes: 462 cells/uL (ref 200–950)
Basophils Absolute: 63 cells/uL (ref 0–200)
Basophils Relative: 1.1 %
Eosinophils Absolute: 108 cells/uL (ref 15–500)
Eosinophils Relative: 1.9 %
HCT: 35.6 % (ref 35.0–45.0)
MCH: 30.4 pg (ref 27.0–33.0)
MCHC: 35.4 g/dL (ref 32.0–36.0)
Monocytes Relative: 8.1 %
Neutro Abs: 3443 cells/uL (ref 1500–7800)
Platelets: 294 10*3/uL (ref 140–400)
Total Lymphocyte: 28.5 %

## 2022-06-05 LAB — TIER 1
Ribonucleic Protein(ENA) Antibody, IgG: <1 AI
SM/RNP: <1 AI
ds DNA Ab: 20 IU/mL — ABNORMAL HIGH

## 2022-06-05 LAB — INTERPRETATION

## 2022-06-05 LAB — COMPLETE METABOLIC PANEL WITH GFR
ALT: 44 U/L — ABNORMAL HIGH (ref 6–29)
AST: 24 U/L (ref 10–30)
Alkaline phosphatase (APISO): 68 U/L (ref 31–125)
Potassium: 4.3 mmol/L (ref 3.5–5.3)
Sodium: 141 mmol/L (ref 135–146)
Total Bilirubin: 0.3 mg/dL (ref 0.2–1.2)
Total Protein: 6.1 g/dL (ref 6.1–8.1)

## 2022-06-05 LAB — ANA SCREEN,IFA,REFLEX TITER/PATTERN,REFLEX MPLX 11 AB CASCADE
Anti Nuclear Antibody (ANA): POSITIVE — AB
Cyclic Citrullin Peptide Ab: <16 UNITS
MUTATED CITRULLINATED VIMENTIN (MCV) AB: <20 U/mL (ref <20)
Rheumatoid fact SerPl-aCnc: <10 IU/mL (ref <14)

## 2022-06-05 LAB — B. BURGDORFI ANTIBODIES: B burgdorferi Ab IgG+IgM: <0.9 index

## 2022-06-05 LAB — ANTI-NUCLEAR AB-TITER (ANA TITER): ANA Titer 1: 1:40 {titer} — ABNORMAL HIGH

## 2022-07-15 ENCOUNTER — Other Ambulatory Visit: Payer: Self-pay | Admitting: Medical-Surgical

## 2022-07-28 ENCOUNTER — Encounter: Payer: Self-pay | Admitting: Medical-Surgical

## 2022-08-04 ENCOUNTER — Encounter: Payer: Self-pay | Admitting: Medical-Surgical

## 2022-08-04 ENCOUNTER — Ambulatory Visit (INDEPENDENT_AMBULATORY_CARE_PROVIDER_SITE_OTHER): Payer: Commercial Managed Care - PPO | Admitting: Medical-Surgical

## 2022-08-04 VITALS — BP 124/80 | HR 82 | Resp 20 | Ht 63.0 in | Wt 191.2 lb

## 2022-08-04 DIAGNOSIS — Z13220 Encounter for screening for lipoid disorders: Secondary | ICD-10-CM

## 2022-08-04 DIAGNOSIS — Z0000 Encounter for general adult medical examination without abnormal findings: Secondary | ICD-10-CM | POA: Diagnosis not present

## 2022-08-04 NOTE — Progress Notes (Signed)
Complete physical exam  Patient: Carol Castillo   DOB: 10/24/1985   37 y.o. Female  MRN: [id number]  Subjective:    Chief Complaint  Patient presents with   Annual Exam   Carol Castillo is a 37 y.o. female who presents today for a complete physical exam. She reports consuming a  general  diet with intermittent fasting. The patient does not participate in regular exercise at present. She generally feels poorly. She reports sleeping poorly. She does have additional problems to discuss today.    Most recent fall risk assessment:    06/01/2022    4:03 PM  Fall Risk   Falls in the past year? 0  Number falls in past yr: 0  Injury with Fall? 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 Do not take for sleep. ONLY USE FOR SEVERE ANXIETY.   Ascorbic Acid (VITAMIN C) 100 MG tablet Take by mouth.   Biotin 78295 MCG TABS Take 10,000 mcg by mouth daily.   buPROPion (WELLBUTRIN XL) 300 MG 24 hr tablet Take 1 tablet by mouth once daily   Cobalamin Combinations (B12 FOLATE PO) Take by mouth.   DULoxetine (CYMBALTA) 60 MG capsule Take 1 capsule (60 mg total) by mouth at bedtime.   levonorgestrel (MIRENA) 20 MCG/24HR IUD by Intrauterine route.   meclizine (ANTIVERT) 25 MG tablet Take 1 tablet (25 mg total) by mouth 3 (three) times daily as needed for dizziness.   OMEGA-3-ACID  ETH EST, DIETARY, PO Take 2,400 mg by mouth at bedtime.   predniSONE (DELTASONE) 20 MG tablet Take 3 tablets for 3 days, take 2 tablets for 3 days, take 1 tablet for 3 days, take 1/2 tablet for 4 days.   SUMAtriptan (IMITREX) 50 MG tablet TAKE 1 TABLET BY MOUTH NOW REPEAT ONCE IN TWO HOURS IF NEEDED   Vitamin D, Cholecalciferol, 25 MCG (1000 UT) TABS Take by mouth.   Zinc 100 MG TABS Take by mouth.   No facility-administered medications prior to visit.    Review of Systems  Constitutional:  Positive for malaise/fatigue. Negative for chills, fever and weight loss.  HENT:  Negative for congestion, ear pain, hearing loss, sinus pain and sore throat.   Eyes:  Negative for blurred vision, photophobia and pain.  Respiratory:  Negative for cough, shortness of breath and wheezing.   Cardiovascular:  Negative for chest pain, palpitations and leg swelling.  Gastrointestinal:  Negative for abdominal pain, constipation, diarrhea, heartburn, nausea and vomiting.  Genitourinary:  Negative for dysuria, frequency and urgency.  Musculoskeletal:  Negative for falls and neck pain.  Skin:  Negative for itching and rash.  Neurological:  Negative for dizziness, weakness and headaches.  Endo/Heme/Allergies:  Negative for polydipsia. Does not bruise/bleed easily.  Psychiatric/Behavioral:  Negative for depression,  substance abuse and suicidal ideas. The patient is nervous/anxious and has insomnia.      Objective:    BP 124/80 (BP Location: Right Arm, Cuff Size: Normal)   Pulse 82   Resp 20   Ht 5\' 3"  (1.6 m)   Wt 191 lb 3.2 oz (86.7 kg)   SpO2 100%   BMI 33.87 kg/m    Physical Exam Vitals reviewed.  Constitutional:      General: She is not in acute distress.    Appearance: Normal appearance. She is not ill-appearing.  HENT:     Head: Normocephalic and atraumatic.     Right Ear: Tympanic membrane, ear canal and external ear normal. There is no impacted cerumen.     Left Ear: Tympanic membrane, ear  canal and external ear normal. There is no impacted cerumen.     Nose: Nose normal. No congestion or rhinorrhea.     Mouth/Throat:     Mouth: Mucous membranes are moist.     Pharynx: No oropharyngeal exudate or posterior oropharyngeal erythema.  Eyes:     General: No scleral icterus.       Right eye: No discharge.        Left eye: No discharge.     Extraocular Movements: Extraocular movements intact.     Conjunctiva/sclera: Conjunctivae normal.     Pupils: Pupils are equal, round, and reactive to light.  Neck:     Thyroid: No thyromegaly.     Vascular: No carotid bruit or JVD.     Trachea: Trachea normal.  Cardiovascular:     Rate and Rhythm: Normal rate and regular rhythm.     Pulses: Normal pulses.     Heart sounds: Normal heart sounds. No murmur heard.    No friction rub. No gallop.  Pulmonary:     Effort: Pulmonary effort is normal. No respiratory distress.     Breath sounds: Normal breath sounds. No wheezing.  Abdominal:     General: Bowel sounds are normal. There is no distension.     Palpations: Abdomen is soft.     Tenderness: There is no abdominal tenderness. There is no guarding.  Musculoskeletal:        General: Normal range of motion.     Cervical back: Normal range of motion and neck supple.  Lymphadenopathy:     Cervical: No cervical adenopathy.  Skin:    General: Skin is warm and dry.  Neurological:     Mental Status: She is alert and oriented to person, place, and time.     Cranial Nerves: No cranial nerve deficit.  Psychiatric:        Mood and Affect: Mood normal.        Behavior: Behavior normal.        Thought Content: Thought content normal.        Judgment: Judgment normal.    No results found for any visits on 08/04/22.     Assessment & Plan:    Routine Health Maintenance and Physical Exam  Immunization History  Administered Date(s) Administered   DTaP 01/27/1984, 03/31/1984, 05/27/1984, 08/25/1985, 01/20/1989   HPV Quadrivalent 07/10/2010,  09/13/2010, 01/24/2011   Hepatitis B, PED/ADOLESCENT 10/18/1991, 11/22/1991, 04/19/1992   Influenza Split 11/16/2012   Influenza, Seasonal, Injecte, Preservative Fre 11/12/2016   Influenza,inj,Quad PF,6-35 Mos 10/22/2018   Influenza-Unspecified 10/22/2018, 11/09/2020   MMR 02/27/1985, 08/07/1994   Meningococcal Acwy, Unspecified 11/16/2002   Meningococcal Conjugate 11/16/2002   PPD Test 06/08/2012, 11/18/2012   Td 09/17/1995  Tdap 03/27/2005, 04/15/2016    Health Maintenance  Topic Date Due   Hepatitis C Screening  08/04/2023 (Originally 11/26/2001)   HIV Screening  08/04/2023 (Originally 11/27/1998)   INFLUENZA VACCINE  09/10/2022   PAP SMEAR-Modifier  09/10/2025   DTaP/Tdap/Td (9 - Td or Tdap) 04/16/2026   HPV VACCINES  Completed   COVID-19 Vaccine  Discontinued    Discussed health benefits of physical activity, and encouraged her to engage in regular exercise appropriate for her age and condition.  1. Annual physical exam Recent CBC and CMP checked.  Up-to-date on most preventative care however would recommend updating eye exam.  Sees OB/GYN for well woman care and plans to schedule for updating her Pap smear soon.  Wellness information provided with AVS.  Patient to have her biometric form for insurance/work sent to the office for completion.  We will let her know when this has been done.  2. Lipid screening Checking lipid panel today. - Lipid panel  Return in about 1 year (around 08/04/2023) for annual physical exam.   Christen Butter, NP

## 2022-08-05 LAB — LIPID PANEL
Cholesterol: 194 mg/dL (ref <200)
HDL: 49 mg/dL — ABNORMAL LOW (ref >=50)
LDL Cholesterol (Calc): 120 mg/dL (calc) — ABNORMAL HIGH
Non-HDL Cholesterol (Calc): 145 mg/dL (calc) — ABNORMAL HIGH (ref <130)
Total CHOL/HDL Ratio: 4 (calc) (ref <5.0)
Triglycerides: 130 mg/dL (ref <150)

## 2022-08-11 ENCOUNTER — Encounter: Payer: Self-pay | Admitting: Medical-Surgical

## 2022-08-11 MED ORDER — PHENTERMINE HCL 8 MG PO TABS: ORAL_TABLET | ORAL | 0 refills | Status: DC

## 2022-08-11 MED ORDER — TOPIRAMATE 50 MG PO TABS: ORAL_TABLET | ORAL | 0 refills | Status: DC

## 2022-08-11 MED ORDER — PHENTERMINE-TOPIRAMATE ER 3.75-23 MG PO CP24: 1.0000 | ORAL_CAPSULE | Freq: Every morning | ORAL | 0 refills | Status: DC

## 2022-08-11 NOTE — Addendum Note (Signed)
Addended byChristen Butter on: 08/11/2022 04:56 PM   Modules accepted: Orders

## 2022-08-23 ENCOUNTER — Other Ambulatory Visit: Payer: Self-pay | Admitting: Medical-Surgical

## 2022-09-25 ENCOUNTER — Telehealth: Payer: Self-pay | Admitting: Medical-Surgical

## 2022-09-25 ENCOUNTER — Encounter: Payer: Self-pay | Admitting: Medical-Surgical

## 2022-09-25 NOTE — Telephone Encounter (Signed)
Patient's husband dropped off paperwork to be filled out. Paperwork is in [employer].

## 2022-09-28 NOTE — Telephone Encounter (Signed)
Mailbox full can not leave any messages. I sent a through MyChart. Faxed to [phone] made a copy for scan and for our records and placed original upfront.

## 2022-10-14 ENCOUNTER — Other Ambulatory Visit: Payer: Self-pay | Admitting: Medical-Surgical

## 2022-10-21 ENCOUNTER — Other Ambulatory Visit: Payer: Self-pay | Admitting: Medical-Surgical

## 2022-11-01 ENCOUNTER — Other Ambulatory Visit: Payer: Self-pay | Admitting: Medical-Surgical

## 2022-11-02 ENCOUNTER — Encounter: Payer: Self-pay | Admitting: Medical-Surgical

## 2022-11-04 ENCOUNTER — Encounter: Payer: Self-pay | Admitting: Medical-Surgical

## 2022-11-04 NOTE — Telephone Encounter (Signed)
Pt is scheduled for tomorrow at 340

## 2022-11-05 ENCOUNTER — Ambulatory Visit: Payer: Commercial Managed Care - PPO | Admitting: Medical-Surgical

## 2022-11-05 ENCOUNTER — Encounter: Payer: Self-pay | Admitting: Medical-Surgical

## 2022-11-05 VITALS — BP 122/80 | HR 77 | Resp 20 | Ht 63.0 in | Wt 186.4 lb

## 2022-11-05 DIAGNOSIS — L03221 Cellulitis of neck: Secondary | ICD-10-CM

## 2022-11-05 MED ORDER — KETOROLAC TROMETHAMINE 60 MG/2ML IM SOLN
60.0000 mg | Freq: Once | INTRAMUSCULAR | Status: AC
Start: 2022-11-05 — End: 2022-11-05
  Administered 2022-11-05: 60 mg via INTRAMUSCULAR

## 2022-11-05 MED ORDER — FLUCONAZOLE 150 MG PO TABS
150.0000 mg | ORAL_TABLET | Freq: Once | ORAL | 0 refills | Status: AC
Start: 2022-11-05 — End: 2022-11-05

## 2022-11-05 MED ORDER — DOXYCYCLINE HYCLATE 100 MG PO TABS: 100.0000 mg | ORAL_TABLET | Freq: Two times a day (BID) | ORAL | 0 refills | Status: DC

## 2022-11-05 NOTE — Progress Notes (Signed)
        Established patient visit  History, exam, impression, and plan:  1. Cellulitis of neck Pleasant 37 year old female presenting today for evaluation of what is suspected to have been a spider bite on her neck. She was evaluated by [facility] on Saturday/Sunday and was prescribed topical mupirocin and Keflex 500mg  four times daily. She has been taking the medications as prescribed but the lesion has continued to worsen. 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 Now the lesion has become very painful and she has developed right sided neck pain with cervical radiculopathy. No fever, chills, myalgias. See below for clinical photo. Continue topical mupirocin. Concern for impetigo vs MRSA given appearance. D/c keflex. Start Doxycycline 100mg  BID x 7 days. Adding Diflucan for VVC prophylaxis. Monitor for worsening and reach out if any issues or questions.  - doxycycline (VIBRA-TABS) 100 MG tablet; Take 1 tablet (100 mg total) by mouth 2 (two) times daily.  Dispense: 14 tablet; Refill: 0 - fluconazole (DIFLUCAN) 150 MG tablet; Take 1 tablet (150 mg total) by mouth once for 1 dose.  Dispense: 1 tablet; Refill: 0 - ketorolac (TORADOL) injection 60 mg    Procedures performed this visit: None.  Return if symptoms worsen or fail to improve.  __________________________________ Thayer Ohm, DNP, APRN, FNP-BC Primary [facility] and [facility] [place]

## 2022-11-06 ENCOUNTER — Telehealth: Payer: Self-pay

## 2022-11-06 NOTE — Telephone Encounter (Signed)
Patient called  states  was seen yesterday for cellulitis.  States she has been on keflex and took one doxycycline last night and one this morning and covered the area with tegaderm  The school nurse checked the site today - removed the tegaderm and was concerned over how it looked and it had gotten larger.  She is concerned and wanted to know providers thoughts .

## 2022-11-06 NOTE — Telephone Encounter (Signed)
Patient informed. 

## 2022-11-06 NOTE — Telephone Encounter (Signed)
It will take at least a good 48 hours on the doxycycline for it to start making a difference. Continue with the Doxy and topical mupirocin. Plan to complete the 7 day course. If still worsening by Monday, will need to reevaluate the plan.

## 2023-01-09 ENCOUNTER — Other Ambulatory Visit: Payer: Self-pay | Admitting: Medical-Surgical

## 2023-03-22 ENCOUNTER — Ambulatory Visit: Payer: Self-pay | Admitting: Medical-Surgical

## 2023-03-22 NOTE — Telephone Encounter (Signed)
 Chief Complaint: Constipation Symptoms: Constipation, sluggish, tired, firm abdomen Frequency: Constant Pertinent Negatives: Patient denies N/V, abdominal pain, rectal pain Disposition: [] ED /[] Urgent Care (no appt availability in office) / [x] Appointment(In office/virtual)/ []  [facility]/ [] Home Care/ [] Refused Recommended Disposition /[]  Mobile Bus/ []  Follow-up with PCP Additional Notes: Patient called with complaints of constipation. Patient states that she has not had a BM since Tuesday and is now experiencing sluggishness, firm abdomen and tiredness. Patient states she usually goes once a day and has tried Miralax, probiotics, stool softeners, suppository, and increased fluid/water intake to about 50oz/day with no relief of symptoms. Patient denies n/v, abdominal pain, rectal pain, or new medication. Patient states that she is straining to go but also does not have the urge. Patient advised by this RN per protocol to be seen within 24hours to which patient was agreeable. Appt scheduled. Patient advised by this RN to call back/go to ER with worsening symptoms prior to appt. Patient verbalized understanding.   Copied from CRM [id number]. Topic: Clinical - Medical Advice >> Mar 22, 2023  5:07 PM Carol Castillo wrote: Reason for CRM: Patient seeking medical advice on sever constipation. Patient feels tired, run down, very heavy. Patient has not had a bowel movement in 1 week. Patient has tried numerous over the counter medications but known has worked. Reason for Disposition  Last bowel movement (BM) > 4 days ago  Answer Assessment - Initial Assessment Questions 1. STOOL PATTERN OR FREQUENCY: "How often do you have a bowel movement (BM)?"  (Normal range: 3 times a day to every 3 days)  "When was your last BM?"       Once a day 2. STRAINING: "Do you have to strain to have a BM?"      Yes, "I can't go, there's no movement there. There's nothing." 3. RECTAL PAIN: "Does your rectum  hurt when the stool comes out?" If Yes, ask: "Do you have hemorrhoids? How bad is the pain?"  (Scale 1-10; or mild, moderate, severe)     Denies 4. STOOL COMPOSITION: "Are the stools hard?"      Yes, patient unable to have BM. 5. BLOOD ON STOOLS: "Has there been any blood on the toilet tissue or on the surface of the BM?" If Yes, ask: "When was the last time?"     Denies 6. CHRONIC CONSTIPATION: "Is this a new problem for you?"  If No, ask: "How long have you had this problem?" (days, weeks, months)      Since Tuesday 7. CHANGES IN DIET OR HYDRATION: "Have there been any recent changes in your diet?" "How much fluids are you drinking on a daily basis?"  "How much have you had to drink today?"     "I tried to eat things with like fiber and I've been eating apple sauce to try to help me go. I know I dont do that well with water and that might have something to do it with but since this had happened I have been drinking a lot more and I've probably drank around 50oz of water today. 8. MEDICINES: "Have you been taking any new medicines?" "Are you taking any narcotic pain medicines?" (e.g., Dilaudid, morphine, Percocet, Vicodin)     Denies 9. LAXATIVES: "Have you been using any stool softeners, laxatives, or enemas?"  If Yes, ask "What, how often, and when was the last time?"     Yes, last one one hour ago 10. 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 CAUSE: "What do you think is causing the constipation?"        "I dont know I mean I recently started having like perimenopausal symptoms where my hormones have been changing, but I don't know if that could be it. I also have anxiety sometimes but usually it has the opposite affect not constipation." 12. OTHER SYMPTOMS: "Do you have any other symptoms?" (e.g., abdomen pain, bloating, fever, vomiting)       Knee  achy, shoulders achy, quads achy, lethargy. 13. MEDICAL HISTORY: "Do you have a history of hemorrhoids, rectal fissures, or rectal surgery or rectal abscess?"         Denies 14. PREGNANCY: "Is there any chance you are pregnant?" "When was your last menstrual period?"       Denies  Protocols used: Constipation-A-AH

## 2023-03-23 ENCOUNTER — Ambulatory Visit: Payer: Commercial Managed Care - PPO | Admitting: Medical-Surgical

## 2023-04-11 ENCOUNTER — Other Ambulatory Visit: Payer: Self-pay | Admitting: Medical-Surgical

## 2023-05-19 ENCOUNTER — Other Ambulatory Visit: Payer: Self-pay | Admitting: Medical-Surgical

## 2023-06-03 ENCOUNTER — Encounter: Payer: Self-pay | Admitting: Medical-Surgical

## 2023-06-04 ENCOUNTER — Ambulatory Visit: Admitting: Medical-Surgical

## 2023-06-04 NOTE — Telephone Encounter (Signed)
 Patient scheduled.

## 2023-06-04 NOTE — Telephone Encounter (Signed)
 Left message for patient to call back to schedule.

## 2023-06-14 ENCOUNTER — Ambulatory Visit (INDEPENDENT_AMBULATORY_CARE_PROVIDER_SITE_OTHER): Admitting: Medical-Surgical

## 2023-06-14 VITALS — BP 116/79 | HR 74 | Resp 20 | Ht 63.0 in | Wt 187.0 lb

## 2023-06-14 DIAGNOSIS — D8989 Other specified disorders involving the immune mechanism, not elsewhere classified: Secondary | ICD-10-CM | POA: Insufficient documentation

## 2023-06-14 DIAGNOSIS — E781 Pure hyperglyceridemia: Secondary | ICD-10-CM

## 2023-06-14 DIAGNOSIS — Z0000 Encounter for general adult medical examination without abnormal findings: Secondary | ICD-10-CM

## 2023-06-14 MED ORDER — DULOXETINE HCL 60 MG PO CPEP: 60.0000 mg | ORAL_CAPSULE | Freq: Every day | ORAL | 3 refills | Status: AC

## 2023-06-14 MED ORDER — BUPROPION HCL ER (XL) 300 MG PO TB24: 300.0000 mg | ORAL_TABLET | Freq: Every day | ORAL | 3 refills | Status: AC

## 2023-06-14 NOTE — Patient Instructions (Signed)

## 2023-06-14 NOTE — Progress Notes (Signed)
 Complete physical exam  Patient: Carol Castillo   DOB: [birth year]   38 y.o. Female  MRN: [id number]  Subjective:    Chief Complaint  Patient presents with   Annual Exam   Carol Castillo is a 38 y.o. female who presents today for a complete physical exam. She reports consuming a general diet.  Walking for exercise.  She generally feels well. She reports sleeping fairly well. She does not have additional problems to discuss today.    Most recent fall risk assessment:    06/01/2022    4:03 PM  Fall Risk   Falls in the past year? 0  Number falls in past yr: 0  Injury with Fall? 0  Risk for fall due to : No Fall Risks  Follow up Falls evaluation completed     Most recent depression screenings:    06/01/2022    4:03 PM 02/23/2022    8:27 AM  PHQ 2/9 Scores  PHQ - 2 Score 0 0    Vision:Not within last year  and Dental: No current dental problems and Receives regular dental care    Patient Care Team: Cherre Cornish, NP as PCP - General (Nurse Practitioner) Terri Fester, MD as Attending Physician (Obstetrics and Gynecology)   Outpatient Medications Prior to Visit  Medication Sig   doxycycline  (VIBRA -TABS) 100 MG tablet Take 1 tablet (100 mg total) by mouth 2 (two) times daily.   levonorgestrel (MIRENA) 20 MCG/24HR IUD by Intrauterine route.   OMEGA-3-ACID ETH EST, DIETARY, PO Take 2,400 mg by mouth at bedtime.   SUMAtriptan (IMITREX) 50 MG tablet TAKE 1 TABLET BY MOUTH NOW REPEAT ONCE IN TWO HOURS IF NEEDED   Vitamin D, Cholecalciferol, 25 MCG (1000 UT) TABS Take by mouth.   Zinc 100 MG TABS Take by mouth.   [DISCONTINUED] buPROPion  (WELLBUTRIN  XL) 300 MG 24 hr tablet Take 1 tablet (300 mg total) by mouth daily. NEEDS APPOINTMENT FOR FURTHER REFILLS.   [DISCONTINUED] DULoxetine  (CYMBALTA ) 60 MG capsule Take 1 capsule (60 mg total) by mouth at bedtime. NEEDS APPOINTMENT FOR FURTHER REFILLS.   ALPRAZolam  (XANAX ) 0.25 MG tablet Take 1 tablet (0.25 mg total) by mouth daily as  needed for anxiety. Do not take for sleep. ONLY USE FOR SEVERE ANXIETY. (Patient not taking: Reported on 06/14/2023)   [DISCONTINUED] Ascorbic Acid (VITAMIN C) 100 MG tablet Take by mouth.   [DISCONTINUED] Biotin 72536 MCG TABS Take 10,000 mcg by mouth daily.   [DISCONTINUED] cephALEXin (KEFLEX) 500 MG capsule Take 500 mg by mouth 4 (four) times daily.   [DISCONTINUED] Cobalamin Combinations (B12 FOLATE PO) Take by mouth.   [DISCONTINUED] meclizine  (ANTIVERT ) 25 MG tablet Take 1 tablet (25 mg total) by mouth 3 (three) times daily as needed for dizziness.   [DISCONTINUED] Phentermine  HCl 8 MG TABS Take 1/2 tablet (4mg ) daily for 14 days then increase to 1 full tablet (8mg ) daily.   [DISCONTINUED] topiramate  (TOPAMAX ) 50 MG tablet Take 1/2 tablet (25mg ) daily x 14 days then increase to 1 full tablet (50mg ) daily.   No facility-administered medications prior to visit.   Review of Systems  Constitutional:  Negative for chills, fever, malaise/fatigue and weight loss.  HENT:  Negative for congestion, ear pain, hearing loss, sinus pain and sore throat.   Eyes:  Negative for blurred vision, photophobia and pain.  Respiratory:  Negative for cough, shortness of breath and wheezing.   Cardiovascular:  Negative for chest pain, palpitations and leg swelling.  Gastrointestinal:  Negative for abdominal pain,  constipation, diarrhea, heartburn, nausea and vomiting.  Genitourinary:  Negative for dysuria, frequency and urgency.  Musculoskeletal:  Negative for falls and neck pain.  Skin:  Negative for itching and rash.  Neurological:  Negative for dizziness, weakness and headaches.  Endo/Heme/Allergies:  Negative for polydipsia. Does not bruise/bleed easily.  Psychiatric/Behavioral:  Positive for substance abuse. Negative for depression and suicidal ideas. The patient is nervous/anxious and has insomnia.      Objective:    BP 116/79   Pulse 74   Resp 20   Ht 5\' 3"  (1.6 m)   Wt 187 lb (84.8 kg)   SpO2 100%    BMI 33.13 kg/m    Physical Exam Vitals reviewed.  Constitutional:      General: She is not in acute distress.    Appearance: Normal appearance. She is obese. She is not ill-appearing.  HENT:     Head: Normocephalic and atraumatic.     Right Ear: Tympanic membrane, ear canal and external ear normal. There is no impacted cerumen.     Left Ear: Tympanic membrane, ear canal and external ear normal. There is no impacted cerumen.     Nose: Nose normal. No congestion or rhinorrhea.     Mouth/Throat:     Mouth: Mucous membranes are moist.     Pharynx: No oropharyngeal exudate or posterior oropharyngeal erythema.  Eyes:     General: No scleral icterus.       Right eye: No discharge.        Left eye: No discharge.     Extraocular Movements: Extraocular movements intact.     Conjunctiva/sclera: Conjunctivae normal.     Pupils: Pupils are equal, round, and reactive to light.  Neck:     Thyroid: No thyromegaly.     Vascular: No carotid bruit or JVD.     Trachea: Trachea normal.  Cardiovascular:     Rate and Rhythm: Normal rate and regular rhythm.     Pulses: Normal pulses.     Heart sounds: Normal heart sounds. No murmur heard.    No friction rub. No gallop.  Pulmonary:     Effort: Pulmonary effort is normal. No respiratory distress.     Breath sounds: Normal breath sounds. No wheezing.  Abdominal:     General: Bowel sounds are normal. There is no distension.     Palpations: Abdomen is soft.     Tenderness: There is no abdominal tenderness. There is no guarding.  Musculoskeletal:        General: Normal range of motion.     Cervical back: Normal range of motion and neck supple.  Lymphadenopathy:     Cervical: No cervical adenopathy.  Skin:    General: Skin is warm and dry.  Neurological:     Mental Status: She is alert and oriented to person, place, and time.     Cranial Nerves: No cranial nerve deficit.  Psychiatric:        Mood and Affect: Mood is anxious.        Speech:  Speech is rapid and pressured.        Behavior: Behavior normal.        Thought Content: Thought content normal.        Judgment: Judgment normal.   No results found for any visits on 06/14/23.     Assessment & Plan:    Routine Health Maintenance and Physical Exam  Immunization History  Administered Date(s) Administered   DTaP 01/27/1984, 03/31/1984, 05/27/1984, 08/25/1985, 01/20/1989  HPV Quadrivalent 07/10/2010, 09/13/2010, 01/24/2011   Hepatitis B, PED/ADOLESCENT 10/18/1991, 11/22/1991, 04/19/1992   Influenza Split 11/16/2012   Influenza, Seasonal, Injecte, Preservative Fre 11/12/2016   Influenza,inj,Quad PF,6-35 Mos 10/22/2018   Influenza-Unspecified 10/22/2018, 11/09/2020   MMR 02/27/1985, 08/07/1994   Meningococcal Acwy, Unspecified 11/16/2002   Meningococcal Conjugate 11/16/2002   PPD Test 06/08/2012, 11/18/2012   Td 09/17/1995   Tdap 03/27/2005, 04/15/2016    Health Maintenance  Topic Date Due   Hepatitis C Screening  08/04/2023 (Originally 11/26/2001)   HIV Screening  08/04/2023 (Originally 11/27/1998)   INFLUENZA VACCINE  09/10/2023   Cervical Cancer Screening (HPV/Pap Cotest)  09/11/2023   DTaP/Tdap/Td (9 - Td or Tdap) 04/16/2026   HPV VACCINES  Completed   Meningococcal B Vaccine  Aged Out   Pneumococcal Vaccine 45-44 Years old  Discontinued   COVID-19 Vaccine  Discontinued    Discussed health benefits of physical activity, and encouraged her to engage in regular exercise appropriate for her age and condition.  1. Annual physical exam (Primary) Checking labs as below.  Up-to-date on preventative care.  Recommend setting up an eye exam at her convenience.  Wellness information provided with AVS. - CBC with Differential/Platelet - Comprehensive metabolic panel with GFR - Lipid panel  2. Hypertriglyceridemia Checking lipids. - Lipid panel   Return in about 1 year (around 06/13/2024) for annual physical exam or sooner if needed.     Arrin Ishler,  NP

## 2023-06-15 ENCOUNTER — Encounter: Payer: Self-pay | Admitting: Medical-Surgical

## 2023-06-15 LAB — CBC WITH DIFFERENTIAL/PLATELET
Basophils Absolute: 0 10*3/uL (ref 0.0–0.2)
Basos: 0 %
EOS (ABSOLUTE): 0.1 10*3/uL (ref 0.0–0.4)
Eos: 1 %
Hematocrit: 41.3 % (ref 34.0–46.6)
Hemoglobin: 14.2 g/dL (ref 11.1–15.9)
Immature Grans (Abs): 0 10*3/uL (ref 0.0–0.1)
Immature Granulocytes: 0 %
Lymphocytes Absolute: 2.3 10*3/uL (ref 0.7–3.1)
Lymphs: 42 %
MCH: 31.3 pg (ref 26.6–33.0)
MCHC: 34.4 g/dL (ref 31.5–35.7)
MCV: 91 fL (ref 79–97)
Monocytes Absolute: 0.4 10*3/uL (ref 0.1–0.9)
Monocytes: 7 %
Neutrophils Absolute: 2.7 10*3/uL (ref 1.4–7.0)
Neutrophils: 50 %
Platelets: 254 10*3/uL (ref 150–450)
RBC: 4.53 x10E6/uL (ref 3.77–5.28)
RDW: 12.3 % (ref 11.7–15.4)
WBC: 5.4 10*3/uL (ref 3.4–10.8)

## 2023-06-15 LAB — COMPREHENSIVE METABOLIC PANEL WITH GFR
ALT: 19 IU/L (ref 0–32)
AST: 19 IU/L (ref 0–40)
Albumin: 4.6 g/dL (ref 3.9–4.9)
Alkaline Phosphatase: 76 IU/L (ref 44–121)
BUN/Creatinine Ratio: 13 (ref 9–23)
BUN: 10 mg/dL (ref 6–20)
Bilirubin Total: 0.6 mg/dL (ref 0.0–1.2)
CO2: 22 mmol/L (ref 20–29)
Calcium: 9.2 mg/dL (ref 8.7–10.2)
Chloride: 105 mmol/L (ref 96–106)
Creatinine, Ser: 0.78 mg/dL (ref 0.57–1.00)
Globulin, Total: 1.6 g/dL (ref 1.5–4.5)
Glucose: 79 mg/dL (ref 70–99)
Potassium: 5.3 mmol/L — ABNORMAL HIGH (ref 3.5–5.2)
Sodium: 140 mmol/L (ref 134–144)
Total Protein: 6.2 g/dL (ref 6.0–8.5)
eGFR: 99 mL/min/{1.73_m2} (ref >59)

## 2023-06-15 LAB — LIPID PANEL
Chol/HDL Ratio: 3.8 ratio (ref 0.0–4.4)
Cholesterol, Total: 202 mg/dL — ABNORMAL HIGH (ref 100–199)
HDL: 53 mg/dL (ref >39)
LDL Chol Calc (NIH): 129 mg/dL — ABNORMAL HIGH (ref 0–99)
Triglycerides: 114 mg/dL (ref 0–149)
VLDL Cholesterol Cal: 20 mg/dL (ref 5–40)

## 2023-06-15 MED ORDER — PROPRANOLOL HCL 10 MG PO TABS: 10.0000 mg | ORAL_TABLET | Freq: Three times a day (TID) | ORAL | 1 refills | Status: DC | PRN

## 2023-08-16 ENCOUNTER — Telehealth: Admitting: Medical-Surgical

## 2023-08-18 ENCOUNTER — Encounter: Payer: Self-pay | Admitting: Medical-Surgical

## 2023-09-03 ENCOUNTER — Encounter: Payer: Self-pay | Admitting: Medical-Surgical

## 2023-09-06 NOTE — Telephone Encounter (Signed)
 Please contact patient to get her scheduled for IUD placement with me.

## 2023-09-10 ENCOUNTER — Encounter: Payer: Self-pay | Admitting: Medical-Surgical

## 2023-09-13 MED ORDER — MUPIROCIN 2 % EX OINT: TOPICAL_OINTMENT | CUTANEOUS | 3 refills | Status: DC

## 2023-09-28 NOTE — Progress Notes (Unsigned)
        Established patient visit  History, exam, impression, and plan:  No problem-specific Assessment & Plan notes found for this encounter.  Procedures performed this visit: IUD PROCEDURE NOTE  PERTINENT RESULTS REVIEWED: PREGNANCY TEST PRIOR TO PROCEDURE: {Negative Postive Not Available, List:21482} GONORRHEA/CHLAMYDIA SCREEN: {Negative Postive Not Available, List:21482}  PRIOR TO PROCEDURE: INFORMED CONSENT OBTAINED: {YES NO:22349} SEE SCANNED DOCUMENTS ANY PRETREATMENT: {YES NO:22349}  PHYSICAL EXAM: GYN: No lesions/ulcers to external genitalia, normal urethra, normal vaginal mucosa, physiologic discharge, cervix normal without lesions, uterus not enlarged or tender, adnexa no masses and nontender  DESCRIPTION OF PROCEDURE: Vaginal speculum placed. Cervix and proximal vagina cleaned with Betadine.Tenaculum applied at 12:00 cervical position and gentle traction applied. Uterus sounded to *** (greater than 6cm). IUD placed without difficulty. IUD threads cut to 2-3cm from cervical os. Tenaculum and speculum removed. Patient felt strings. Patient tolerated procedure well. Sterile technique maintained.   IUD INFORMATION: BRAND: *** LOT NUMBER: *** CARD GIVEN TO PATIENT: {YES NO:22349:o}   No follow-ups on file.  __________________________________ Zada FREDRIK Palin, DNP, APRN, FNP-BC Primary Care and Sports Medicine [facility]

## 2023-09-28 NOTE — Patient Instructions (Signed)
IUD AFTER-CARE INSTRUCTIONS: READ THOROUGHLY  Your Mirena IUD is currently approved to remain in place for 8 years. At that time, if you wish to receive a new IUD, this can be placed when your current one is removed. If you wish to remove your IUD at any time, for any reason, this can be done easily by your doctor.   You should feel for the strings to your IUD routinely. If you cannot locate the strings, it is recommended you alert your doctor of this.   Be aware that in the first few weeks, your new IUD may cause some discomfort/cramping as it settles into place in your uterus. To ease discomfort, you may apply heating pad to abdomen and take Ibuprofen 800mg by mouth every 6 hours as needed, but avoid using this dose continuously for more than 5 days. Walking helps as well. You can also expect some irregular bleeding but it should not be heavy for more than a few days.   If pain is severe or if severe bleeding occurs, or if foul-smelling discharge or fever develops, or if you have any other concerns - contact your doctor right away or go to the Emergency Room.  IUD's are a very reliable method of birth control, but no method is 100% effective. If you think you may be pregnant, see your doctor right away.   An IUD will not protect you from sexually transmitted infections such as HIV, gonorrhea, chlamydia, HPV and others.   It is recommended that you see your doctor as directed for routine well-woman care, which includes Pap testing and may include screening for infections.   

## 2023-09-29 ENCOUNTER — Ambulatory Visit (INDEPENDENT_AMBULATORY_CARE_PROVIDER_SITE_OTHER): Admitting: Medical-Surgical

## 2023-09-29 ENCOUNTER — Other Ambulatory Visit (HOSPITAL_COMMUNITY)
Admission: RE | Admit: 2023-09-29 | Discharge: 2023-09-29 | Disposition: A | Discharge status: Home / Self Care | Source: Ambulatory Visit | Attending: Medical-Surgical | Admitting: Medical-Surgical

## 2023-09-29 VITALS — BP 115/76 | HR 68 | Resp 20 | Ht 63.0 in | Wt 186.0 lb

## 2023-09-29 DIAGNOSIS — Z30430 Encounter for insertion of intrauterine contraceptive device: Secondary | ICD-10-CM

## 2023-09-29 DIAGNOSIS — Z124 Encounter for screening for malignant neoplasm of cervix: Secondary | ICD-10-CM | POA: Insufficient documentation

## 2023-09-29 LAB — POCT URINE PREGNANCY: Preg Test, Ur: NEGATIVE

## 2023-09-29 MED ORDER — LEVONORGESTREL 20 MCG/DAY IU IUD
1.0000 | INTRAUTERINE_SYSTEM | Freq: Once | INTRAUTERINE | Status: AC
  Administered 2023-09-29: 1 via INTRAUTERINE

## 2023-09-29 NOTE — Addendum Note (Signed)
 Addended by: FANNY NIELS CROME on: 09/29/2023 01:20 PM   Modules accepted: Orders

## 2023-09-29 NOTE — Addendum Note (Signed)
 Addended by: FANNY NIELS CROME on: 09/29/2023 12:06 PM   Modules accepted: Orders

## 2023-09-30 ENCOUNTER — Ambulatory Visit: Payer: Self-pay | Admitting: Medical-Surgical

## 2023-09-30 LAB — CYTOLOGY - PAP
Comment: NEGATIVE
Diagnosis: NEGATIVE
High risk HPV: NEGATIVE

## 2023-11-18 ENCOUNTER — Ambulatory Visit: Admitting: Medical-Surgical

## 2024-01-21 ENCOUNTER — Ambulatory Visit: Payer: Self-pay

## 2024-01-21 NOTE — Telephone Encounter (Signed)
 FYI Only or Action Required?: Action required by provider: update on patient condition.  Patient requesting anti-viral mediation.  Exposed to influenza yesterday and symptom onset yesterday.  Is going to get home test or go to Urgent Care for testing to confirm influenza.   Patient was last seen in primary care on 09/29/2023 by Willo Mini, NP.  Called Nurse Triage reporting Cough.  Symptoms began yesterday.  Interventions attempted: OTC medications: Cold/Flu Medicine with Acetaminophen and Rest, hydration, or home remedies.  Symptoms are: gradually worsening.  Triage Disposition: Home Care  Patient/caregiver understands and will follow disposition?: Yes     Copied from CRM #[id number]. Topic: Clinical - Red Word Triage >> Jan 21, 2024  2:24 PM Mercer PEDLAR wrote: Red Word that prompted transfer to Nurse Triage: chest discomfort, cough, shortness of breath after coughing, and has been exposed to the flu and would like to be tested to check if she has it.    Reason for Disposition  Mild central chest pain that only occurs when coughing is also present  Additional Information  Negative: [1] Patient is NOT HIGH RISK AND [2] strongly requests antiviral medicine AND [3] flu symptoms present < 48 hours    Is requesting Antiviral, symptoms present <48 hours, will notify provider.  Answer Assessment - Initial Assessment Questions 1. SYMPTOMS: What is your main symptom or concern? (e.g., cough, fever, shortness of breath, muscle aches)      Deep Cough, chest irritation, no shortness of breath- not struggling to breathe, gets irritated by cold air, deep breaths, states not concerned about breathing, more so calling to protect others, wants to know if has influenza so can prevent others from getting this;  reports history of bronchitis and this also feels like that, known flu exposure yesterday   2. ONSET: When did the symptoms start?      Yesterday - discomfort/irritation in chest.   Cough today. 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   3. COUGH: Do you have a cough? If Yes, ask: How bad is the cough?       Moderate, bothersome.  Audible during call with occasional wheeze when coughing.   4. FEVER: Do you have a fever? If Yes, ask: What is your temperature, how was it measured, and when did it start?     Denies, thought had started to run fever earlier but not now, does not have reliable thermometer.   Notices occasional elevated heart rate after coughing spells, not constant.   5. BREATHING DIFFICULTY: Are you having any difficulty breathing? (e.g., normal; shortness of breath, wheezing, unable to speak)      Denies.   6. BETTER-SAME-WORSE: Are you getting better, staying the same or getting worse compared to yesterday?  If getting worse, ask, In what way?     Much worse today (cough).   7. OTHER SYMPTOMS: Do you have any other symptoms?  (e.g., chills, fatigue, headache, loss of smell or taste, muscle pain, sore throat)     Increased heart rate after coughing.   8. INFLUENZA EXPOSURE: Was there any known exposure to influenza (flu) before the symptoms began?      Yes  9. INFLUENZA SUSPECTED: Why do you think you have influenza? (e.g., positive flu self-test at home, symptoms after exposure).     Yes- symptoms after known exposure yesterday.   10. INFLUENZA VACCINE: Have you had the flu vaccine? If Yes, ask: When did you last  get it?       Past history of receiving, did not receive this season.   11. HIGH RISK FOR COMPLICATIONS: Do you have any chronic medical problems? (e.g., asthma, heart or lung disease, obesity, weak immune system)       No history of asthma, heart or lung disease. No weakened immune system. Past history of smoking.   12. PREGNANCY: Is there any chance you are pregnant? When was your last menstrual period?       IUD replaced in August. No periods recently.    13. O2 SATURATION MONITOR:  Do you use an oxygen saturation monitor (pulse oximeter) at home? If Yes, ask What is your reading (oxygen level) today? What is your usual oxygen saturation reading? (e.g., 95%)       Does not have oximeter at home.  States I'm not concerned about my oxygen levels or anything like that   Took cold/flu medicine with acetaminophen- around noon, didn't help.   Able to breathe through sinuses, no runny nose.  Protocols used: Influenza (Flu) Suspected-A-AH

## 2024-01-23 ENCOUNTER — Other Ambulatory Visit: Payer: Self-pay

## 2024-01-23 ENCOUNTER — Ambulatory Visit
Admission: RE | Admit: 2024-01-23 | Discharge: 2024-01-23 | Disposition: A | Discharge status: Home / Self Care | Source: Ambulatory Visit | Attending: Family Medicine | Admitting: Family Medicine

## 2024-01-23 VITALS — BP 163/114 | HR 91 | Temp 99.9°F | Resp 16 | Wt 178.0 lb

## 2024-01-23 DIAGNOSIS — J101 Influenza due to other identified influenza virus with other respiratory manifestations: Secondary | ICD-10-CM

## 2024-01-23 DIAGNOSIS — R509 Fever, unspecified: Secondary | ICD-10-CM

## 2024-01-23 LAB — POC SOFIA SARS ANTIGEN FIA: SARS Coronavirus 2 Ag: NEGATIVE

## 2024-01-23 LAB — POCT INFLUENZA A/B
Influenza A, POC: POSITIVE — AB
Influenza B, POC: NEGATIVE

## 2024-01-23 MED ORDER — OSELTAMIVIR PHOSPHATE 75 MG PO CAPS: 75.0000 mg | ORAL_CAPSULE | Freq: Two times a day (BID) | ORAL | 0 refills | Status: AC

## 2024-01-23 NOTE — ED Triage Notes (Addendum)
 C/o body aches, cough, fever since Thursday. Tested negative for covid and flu at the [employer]. Feel terrible. Tried to make pcp appointment but they told her to manage symptoms at home. Sudafed, tylenol, ibuprofen. Initially felt like bronchitis when her illness started, per pt.

## 2024-01-23 NOTE — ED Provider Notes (Signed)
 [name]    CSN: [id number] Arrival date & time: 01/23/24  0908      History   Chief Complaint Chief Complaint  Patient presents with   Cough    Body aches, tested negative for Covid and flu as of 12/12 - Entered by patient   Generalized Body Aches   Fever    HPI Carol Castillo is a 38 y.o. female.   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  She also has a friend with influenza.  She is here with flu symptoms.  She has cough and cold symptoms.  Severe fatigue, weakness, body aches.  Sweats chills and fever.  She did flu test at [facility] but it was negative.  She is here for follow-up because she continues to feel awful.  Past Medical History:  Diagnosis Date   Anxiety    Bell's palsy    Bronchitis    GAD (generalized anxiety disorder)    Genital warts    Treated with Aldara   Migraine    Plantar fasciitis    Pregnancy induced hypertension     Patient Active Problem List   Diagnosis Date Noted   Other specified disorders involving the immune mechanism, not elsewhere classified 06/14/2023   Vitamin D deficiency 05/09/2019   Hypertriglyceridemia 09/10/2018   Class 1 obesity without serious comorbidity with body mass index (BMI) of 34.0 to 34.9 in adult 09/09/2018   Migraine headache 12/04/2011   H/O abnormal cervical Papanicolaou smear 07/21/2011   Anxiety     Past Surgical History:  Procedure Laterality Date   CERVICAL BIOPSY  08/01/2010   Low Grade Squamous Intraepithelial lesion, CIN-1 (mild dysplasia)   CERVICAL BIOPSY  W/ LOOP ELECTRODE EXCISION  02/10/2004   Moderate Dysplasia   CESAREAN SECTION      OB History     Gravida  1   Para  1   Term      Preterm  1   AB      Living  2      SAB      IAB      Ectopic      Multiple  1   Live Births  2            Home Medications    Prior to Admission medications  Medication Sig Start Date End Date Taking? Authorizing Provider  oseltamivir   (TAMIFLU ) 75 MG capsule Take 1 capsule (75 mg total) by mouth every 12 (twelve) hours. 01/23/24  Yes Maranda Jamee Jacob, MD  ALPRAZolam  (XANAX ) 0.25 MG tablet Take 1 tablet (0.25 mg total) by mouth daily as needed for anxiety. Do not take for sleep. ONLY USE FOR SEVERE ANXIETY. 09/17/21   Willo Mini, NP  buPROPion  (WELLBUTRIN  XL) 300 MG 24 hr tablet Take 1 tablet (300 mg total) by mouth daily. 06/14/23   Willo Mini, NP  DULoxetine  (CYMBALTA ) 60 MG capsule Take 1 capsule (60 mg total) by mouth at bedtime. 06/14/23   Willo Mini, NP  levonorgestrel  (MIRENA ) 20 MCG/DAY IUD 1 each by Intrauterine route once. 09/29/23   Willo Mini, NP  OMEGA-3-ACID ETH EST, DIETARY, PO Take 2,400 mg by mouth at bedtime.    [provider]  propranolol  (INDERAL ) 10 MG tablet Take 1-2 tablets (10-20 mg total) by mouth 3 (three) times daily as needed. 06/15/23   Willo Mini, NP  SUMAtriptan (IMITREX) 50 MG tablet TAKE 1 TABLET BY MOUTH NOW REPEAT ONCE IN TWO  HOURS IF NEEDED 06/07/18   [provider]  Vitamin D, Cholecalciferol, 25 MCG (1000 UT) TABS Take by mouth.    [provider]  Zinc 100 MG TABS Take by mouth.    [provider]    Family History Family History  Problem Relation Age of Onset   Hypertension Father    Cancer Father    Cancer Sister     Social History Social History[1]   Allergies   Covid-19 (mrna) vaccine   Review of Systems Review of Systems See HPI  Physical Exam Triage Vital Signs ED Triage Vitals  Encounter Vitals Group     BP 01/23/24 0912 (!) 163/114     Girls Systolic BP Percentile --      Girls Diastolic BP Percentile --      Boys Systolic BP Percentile --      Boys Diastolic BP Percentile --      Pulse Rate 01/23/24 0912 91     Resp 01/23/24 0912 16     Temp 01/23/24 0912 99.9 F (37.7 C)     Temp src --      SpO2 01/23/24 0912 99 %     Weight 01/23/24 0916 178 lb (80.7 kg)     Height --      Head Circumference --      Peak Flow --       Pain Score 01/23/24 0915 6     Pain Loc --      Pain Education --      Exclude from Growth Chart --    No data found.  Updated Vital Signs BP (!) 163/114   Pulse 91   Temp 99.9 F (37.7 C)   Resp 16   Wt 80.7 kg   SpO2 99%   BMI 31.53 kg/m     Physical Exam Constitutional:      General: She is not in acute distress.    Appearance: She is well-developed. She is ill-appearing.  HENT:     Head: Normocephalic and atraumatic.     Right Ear: Tympanic membrane normal.     Left Ear: Tympanic membrane normal.     Nose: Nose normal.     Mouth/Throat:     Pharynx: Posterior oropharyngeal erythema present.  Eyes:     Conjunctiva/sclera: Conjunctivae normal.     Pupils: Pupils are equal, round, and reactive to light.  Cardiovascular:     Rate and Rhythm: Normal rate and regular rhythm.     Heart sounds: Normal heart sounds.  Pulmonary:     Effort: Pulmonary effort is normal. No respiratory distress.     Breath sounds: Normal breath sounds.  Musculoskeletal:        General: Normal range of motion.     Cervical back: Normal range of motion and neck supple.  Lymphadenopathy:     Cervical: No cervical adenopathy.  Skin:    General: Skin is warm and dry.  Neurological:     Mental Status: She is alert.      UC Treatments / Results  Labs (all labs ordered are listed, but only abnormal results are displayed) Labs Reviewed  POCT INFLUENZA A/B - Abnormal; Notable for the following components:      Result Value   Influenza A, POC Positive (*)    All other components within normal limits  POC SOFIA SARS ANTIGEN FIA    EKG   Radiology No results found.  Procedures Procedures (including critical care time)  Medications  Ordered in UC Medications - No data to display  Initial Impression / Assessment and Plan / UC Course  I have reviewed the triage vital signs and the nursing notes.  Pertinent labs & imaging results that were available during my care of the  patient were reviewed by me and considered in my medical decision making (see chart for details).     Final Clinical Impressions(s) / UC Diagnoses   Final diagnoses:  Fever in adult  Influenza A     Discharge Instructions      Take Tamiflu  twice a day for 5 days 2 doses today Make sure you are drinking enough water May take Tylenol ibuprofen as needed for the pain and fever Call for problems   ED Prescriptions     Medication Sig Dispense Auth. Provider   oseltamivir  (TAMIFLU ) 75 MG capsule Take 1 capsule (75 mg total) by mouth every 12 (twelve) hours. 10 capsule Maranda Jamee Jacob, MD      PDMP not reviewed this encounter.    [1]  Social History Tobacco Use   Smoking status: Former    Current packs/day: 0.00    Average packs/day: 0.5 packs/day for 20.0 years (10.0 ttl pk-yrs)    Types: Cigarettes    Quit date: 2023    Years since quitting: 2.9   Smokeless tobacco: Never  Substance Use Topics   Alcohol use: Yes    Comment: rarely   Drug use: Never     Maranda Jamee Jacob, MD 01/23/24 1114

## 2024-01-23 NOTE — Discharge Instructions (Signed)
 Take Tamiflu  twice a day for 5 days 2 doses today Make sure you are drinking enough water May take Tylenol ibuprofen as needed for the pain and fever Call for problems

## 2024-03-09 ENCOUNTER — Telehealth: Payer: Self-pay

## 2024-03-09 MED ORDER — PROPRANOLOL HCL 10 MG PO TABS: 10.0000 mg | ORAL_TABLET | Freq: Three times a day (TID) | ORAL | 1 refills | Status: AC | PRN

## 2024-03-09 NOTE — Telephone Encounter (Signed)
 Copied from CRM #[id number]. Topic: Clinical - Medication Refill >> Mar 09, 2024  8:11 AM Montie POUR wrote: Medication:  propranolol  (INDERAL ) 10 MG tablet  Has the patient contacted their pharmacy? No (Agent: If no, request that the patient contact the pharmacy for the refill. If patient does not wish to contact the pharmacy document the reason why and proceed with request.) (Agent: If yes, when and what did the pharmacy advise?) She want order to refill to go to a new pharmacy for today only   This is the patient's preferred pharmacy:  [facility] - [place], [place] - [address] [place] [address] Phone: [phone] Fax: [phone]  Is this the correct pharmacy for this prescription? Yes If no, delete pharmacy and type the correct one.   Has the prescription been filled recently? No  Is the patient out of the medication? Yes - She has been out a few days.   Has the patient been seen for an appointment in the last year OR does the patient have an upcoming appointment? Yes  Can we respond through MyChart? Yes  Agent: Please be advised that Rx refills may take up to 3 business days. We ask that you follow-up with your pharmacy.

## 2024-03-09 NOTE — Telephone Encounter (Signed)
 Request received for rx rf of Propranolol  10mg  Last written 06/15/2023 Last OV 06/14/2023 Upcoming appt = none  Refilled per protocol
